# Patient Record
Sex: Female | Born: 1975 | Hispanic: No | Smoking: Never smoker
Health system: Southern US, Community
[De-identification: ages and names within clinical notes are randomized; demographics above are authoritative.]

## PROBLEM LIST (undated history)

## (undated) DIAGNOSIS — N809 Endometriosis, unspecified: Secondary | ICD-10-CM

## (undated) DIAGNOSIS — B999 Unspecified infectious disease: Secondary | ICD-10-CM

## (undated) DIAGNOSIS — R569 Unspecified convulsions: Secondary | ICD-10-CM

## (undated) DIAGNOSIS — G253 Myoclonus: Secondary | ICD-10-CM

## (undated) DIAGNOSIS — K219 Gastro-esophageal reflux disease without esophagitis: Secondary | ICD-10-CM

## (undated) DIAGNOSIS — A609 Anogenital herpesviral infection, unspecified: Secondary | ICD-10-CM

## (undated) HISTORY — PX: TONSILLECTOMY: SUR1361

## (undated) HISTORY — PX: BREAST SURGERY: SHX581

---

## 2010-09-22 ENCOUNTER — Inpatient Hospital Stay (HOSPITAL_COMMUNITY): Admission: AD | Admit: 2010-09-22 | Payer: Self-pay | Source: Home / Self Care | Admitting: Obstetrics and Gynecology

## 2012-10-21 ENCOUNTER — Emergency Department (HOSPITAL_COMMUNITY)
Admission: EM | Admit: 2012-10-21 | Discharge: 2012-10-21 | Disposition: A | Discharge status: Home / Self Care | Payer: No Typology Code available for payment source | Attending: Emergency Medicine | Admitting: Emergency Medicine

## 2012-10-21 ENCOUNTER — Emergency Department (HOSPITAL_COMMUNITY): Payer: No Typology Code available for payment source

## 2012-10-21 ENCOUNTER — Encounter (HOSPITAL_COMMUNITY): Payer: Self-pay | Admitting: Emergency Medicine

## 2012-10-21 DIAGNOSIS — Y9389 Activity, other specified: Secondary | ICD-10-CM | POA: Insufficient documentation

## 2012-10-21 DIAGNOSIS — S5291XA Unspecified fracture of right forearm, initial encounter for closed fracture: Secondary | ICD-10-CM

## 2012-10-21 DIAGNOSIS — X500XXA Overexertion from strenuous movement or load, initial encounter: Secondary | ICD-10-CM | POA: Insufficient documentation

## 2012-10-21 DIAGNOSIS — S52599A Other fractures of lower end of unspecified radius, initial encounter for closed fracture: Secondary | ICD-10-CM | POA: Insufficient documentation

## 2012-10-21 DIAGNOSIS — Z880 Allergy status to penicillin: Secondary | ICD-10-CM | POA: Insufficient documentation

## 2012-10-21 DIAGNOSIS — Z91040 Latex allergy status: Secondary | ICD-10-CM | POA: Insufficient documentation

## 2012-10-21 DIAGNOSIS — Y929 Unspecified place or not applicable: Secondary | ICD-10-CM | POA: Insufficient documentation

## 2012-10-21 DIAGNOSIS — Z79899 Other long term (current) drug therapy: Secondary | ICD-10-CM | POA: Insufficient documentation

## 2012-10-21 MED ORDER — ONDANSETRON HCL 4 MG PO TABS
4.0000 mg | ORAL_TABLET | Freq: Once | ORAL | Status: AC
  Administered 2012-10-21: 4 mg via ORAL
  Filled 2012-10-21: qty 1

## 2012-10-21 MED ORDER — DICLOFENAC SODIUM 75 MG PO TBEC: 75.0000 mg | DELAYED_RELEASE_TABLET | Freq: Two times a day (BID) | ORAL | Status: DC

## 2012-10-21 MED ORDER — HYDROCODONE-ACETAMINOPHEN 5-325 MG PO TABS
2.0000 | ORAL_TABLET | Freq: Once | ORAL | Status: AC
  Administered 2012-10-21: 2 via ORAL
  Filled 2012-10-21: qty 2

## 2012-10-21 MED ORDER — OXYCODONE-ACETAMINOPHEN 5-325 MG PO TABS: 1.0000 | ORAL_TABLET | ORAL | Status: DC | PRN

## 2012-10-21 NOTE — ED Provider Notes (Signed)
Medical screening examination/treatment/procedure(s) were performed by non-physician practitioner and as supervising physician I was immediately available for consultation/collaboration.    Vida Roller, MD 10/21/12 [phone]

## 2012-10-21 NOTE — ED Notes (Signed)
Pt c/o wrist injury that occurred this morning while lifting weights.

## 2012-10-21 NOTE — ED Provider Notes (Signed)
History     CSN: [id number]  Arrival date & time 10/21/12  1113   First MD Initiated Contact with Patient 10/21/12 1124      Chief Complaint  Patient presents with  . Wrist Injury    (Consider location/radiation/quality/duration/timing/severity/associated sxs/prior treatment) Patient is a 37 y.o. female presenting with wrist injury. The history is provided by the patient.  Wrist Injury Location:  Wrist Time since incident:  1 hour Injury: yes   Mechanism of injury comment:  Pt was lifting a weight when she heard a snap Wrist location:  R wrist Pain details:    Quality:  Throbbing   Radiates to:  Does not radiate   Severity:  Severe   Onset quality:  Sudden   Timing:  Constant   Progression:  Worsening Chronicity:  New Handedness:  Right-handed Dislocation: no   Foreign body present:  No foreign bodies Relieved by:  Nothing Worsened by:  Movement Ineffective treatments:  Ice Associated symptoms: decreased range of motion   Associated symptoms: no back pain, no neck pain and no numbness   Risk factors: no frequent fractures     History reviewed. No pertinent past medical history.  History reviewed. No pertinent past surgical history.  History reviewed. No pertinent family history.  History  Substance Use Topics  . Smoking status: Not on file  . Smokeless tobacco: Not on file  . Alcohol Use: Not on file    OB History   Grav Para Term Preterm Abortions TAB SAB Ect Mult Living                  Review of Systems  Constitutional: Negative for activity change.       All ROS Neg except as noted in HPI  HENT: Negative for nosebleeds and neck pain.   Eyes: Negative for photophobia and discharge.  Respiratory: Negative for cough, shortness of breath and wheezing.   Cardiovascular: Negative for chest pain and palpitations.  Gastrointestinal: Negative for abdominal pain and blood in stool.  Genitourinary: Negative for dysuria, frequency and hematuria.   Musculoskeletal: Negative for back pain and arthralgias.  Skin: Negative.   Neurological: Negative for dizziness, seizures and speech difficulty.  Psychiatric/Behavioral: Negative for hallucinations and confusion.    Allergies  Penicillins; Latex; and Sulfa antibiotics  Home Medications   Current Outpatient Rx  Name  Route  Sig  Dispense  Refill  . desogestrel-ethinyl estradiol (EMOQUETTE) 0.15-30 MG-MCG tablet   Oral   Take 1 tablet by mouth daily.         . Multiple Vitamin (MULTIVITAMIN WITH MINERALS) TABS   Oral   Take 1 tablet by mouth daily.         . diclofenac (VOLTAREN) 75 MG EC tablet   Oral   Take 1 tablet (75 mg total) by mouth 2 (two) times daily.   12 tablet   0   . oxyCODONE-acetaminophen (PERCOCET) 5-325 MG per tablet   Oral   Take 1 tablet by mouth every 4 (four) hours as needed for pain.   20 tablet   0     BP 134/73  Pulse 83  Temp(Src) 98.2 F (36.8 C)  Resp 20  SpO2 100%  LMP 10/09/2012  Physical Exam  Nursing note and vitals reviewed. Constitutional: She is oriented to person, place, and time. She appears well-developed and well-nourished.  Non-toxic appearance.  HENT:  Head: Normocephalic.  Right Ear: Tympanic membrane and external ear normal.  Left Ear: Tympanic membrane and  external ear normal.  Eyes: EOM and lids are normal. Pupils are equal, round, and reactive to light.  Neck: Normal range of motion. Neck supple. Carotid bruit is not present.  Cardiovascular: Normal rate, regular rhythm, normal heart sounds, intact distal pulses and normal pulses.   Pulmonary/Chest: Breath sounds normal. No respiratory distress.  Abdominal: Soft. Bowel sounds are normal. There is no tenderness. There is no guarding.  Musculoskeletal: Normal range of motion.  FROM of the right shoulder and elbow. Pain of  right wrist, mod swelling. Cap refill less than 3 sec. Distal sensory wnl.   Lymphadenopathy:       Head (right side): No submandibular  adenopathy present.       Head (left side): No submandibular adenopathy present.    She has no cervical adenopathy.  Neurological: She is alert and oriented to person, place, and time. She has normal strength. No cranial nerve deficit or sensory deficit.  Skin: Skin is warm and dry.  Psychiatric: She has a normal mood and affect. Her speech is normal.    ED Course  Procedures (including critical care time)  Labs Reviewed - No data to display Dg Wrist Complete Right  10/21/2012   *RADIOLOGY REPORT*  Clinical Data: Injury during weight lifting with pain and swelling of the right wrist.  RIGHT WRIST - COMPLETE 3+ VIEW  Comparison: None.  Findings: There is a transverse, non comminuted transverse fracture of the distal radius.  It crosses the metaphysis.  There is minimal, approximate 3 mm, posterior displacement and there is dorsal angulation of the distal radial articular surface of approximate 16 degrees.  There are no other fractures.  The wrist joints are normally spaced and aligned.  There is mild diffuse surrounding soft tissue swelling.  IMPRESSION: Transverse distal right radial metaphyseal fracture with no significant displacement but with 16 degrees of dorsal articular surface angulation.   Original Report Authenticated By: Amie Portland, M.D.     1. Radius fracture, right, closed, initial encounter       MDM  I have reviewed nursing notes, vital signs, and all appropriate lab and imaging results for this patient.  Pt was lifting a weight when she felt and heard a pop in the right wrist. Pt was taken to her knees due to pain.  Xray reveals a transverse fx of the radium with mild angulation. Pt fitted with sugar tong and sling. Pt to call Dr Sherlean Foot for evaluation next week.      Kathie Dike, PA-C 10/21/12 1249

## 2014-11-01 ENCOUNTER — Ambulatory Visit (INDEPENDENT_AMBULATORY_CARE_PROVIDER_SITE_OTHER): Payer: No Typology Code available for payment source

## 2014-11-01 ENCOUNTER — Encounter: Payer: Self-pay | Admitting: Podiatry

## 2014-11-01 ENCOUNTER — Ambulatory Visit (INDEPENDENT_AMBULATORY_CARE_PROVIDER_SITE_OTHER): Payer: PRIVATE HEALTH INSURANCE | Admitting: Podiatry

## 2014-11-01 VITALS — BP 135/80 | HR 66 | Resp 12 | Ht 63.0 in | Wt 190.0 lb

## 2014-11-01 DIAGNOSIS — M722 Plantar fascial fibromatosis: Secondary | ICD-10-CM | POA: Diagnosis not present

## 2014-11-01 DIAGNOSIS — M79673 Pain in unspecified foot: Secondary | ICD-10-CM | POA: Diagnosis not present

## 2014-11-01 DIAGNOSIS — M79672 Pain in left foot: Secondary | ICD-10-CM | POA: Diagnosis not present

## 2014-11-01 MED ORDER — TRIAMCINOLONE ACETONIDE 10 MG/ML IJ SUSP
10.0000 mg | Freq: Once | INTRAMUSCULAR | Status: AC
  Administered 2014-11-01: 10 mg

## 2014-11-01 MED ORDER — DICLOFENAC SODIUM 75 MG PO TBEC: 75.0000 mg | DELAYED_RELEASE_TABLET | Freq: Two times a day (BID) | ORAL | Status: DC

## 2014-11-01 NOTE — Progress Notes (Signed)
   Subjective:    Patient ID: Laura Valentine, female    DOB: May 13, 1976, 39 y.o.   MRN: [id number]  HPI "I can't put weight on my foot in the heel.  That sucks because I work in the evening.  I've done the ice, hot and Ibuprofen.  It doesn't help because when I get up in the morning it hurts to touch the floor.  It feels bruised."   Review of Systems     Objective:   Physical Exam        Assessment & Plan:

## 2014-11-01 NOTE — Patient Instructions (Signed)

## 2014-11-04 ENCOUNTER — Ambulatory Visit: Payer: No Typology Code available for payment source | Admitting: Podiatry

## 2014-11-04 NOTE — Progress Notes (Signed)
Subjective:     Patient ID: Laura Valentine, female   DOB: 1976-05-03, 39 y.o.   MRN: [id number]  HPI patient states that her left heel has been very sore recently and she does not remember specific injury to it. States that it's hard for her to put it on the ground and she needs to be able to work   Review of Systems  All other systems reviewed and are negative.      Objective:   Physical Exam  Constitutional: She is oriented to person, place, and time.  Cardiovascular: Intact distal pulses.   Musculoskeletal: Normal range of motion.  Neurological: She is oriented to person, place, and time.  Skin: Skin is warm.  Nursing note and vitals reviewed.  neurovascular status intact muscle strength adequate with range of motion within normal limits. Patient's noted to have discomfort plantar aspect left heel at the insertional point tendon into the calcaneus with fluid buildup around the medial band. She has moderate depression of the arch also noted and is found to have good digital perfusion and is well oriented 3     Assessment:     Acute plantar fasciitis left with possibility for stress fracture the calcaneus    Plan:     H&P and x-rays reviewed. At this time I did go ahead and I did a careful plantar injections 3 Milligan Kenalog 5 mill grams Xylocaine and applied brace advised on ice placed on oral anti-inflammatory's and elevation. We will see her back in the next several weeks or earlier if any issues should occur

## 2014-11-15 ENCOUNTER — Ambulatory Visit (INDEPENDENT_AMBULATORY_CARE_PROVIDER_SITE_OTHER): Payer: PRIVATE HEALTH INSURANCE | Admitting: Podiatry

## 2014-11-15 ENCOUNTER — Encounter: Payer: Self-pay | Admitting: Podiatry

## 2014-11-15 VITALS — BP 122/70 | HR 73 | Resp 14

## 2014-11-15 DIAGNOSIS — M79672 Pain in left foot: Secondary | ICD-10-CM

## 2014-11-15 DIAGNOSIS — M722 Plantar fascial fibromatosis: Secondary | ICD-10-CM | POA: Diagnosis not present

## 2014-11-15 MED ORDER — TRIAMCINOLONE ACETONIDE 10 MG/ML IJ SUSP
10.0000 mg | Freq: Once | INTRAMUSCULAR | Status: AC
  Administered 2014-11-15: 10 mg

## 2014-11-16 NOTE — Progress Notes (Signed)
Subjective:     Patient ID: Laura Valentine, female   DOB: 1976-01-11, 40 y.o.   MRN: [id number]  HPI patient presents stating my heel is doing better but it still hurting and this one area   Review of Systems     Objective:   Physical Exam Neurovascular status intact muscle strength adequate with one area medial side but still quite inflamed and sore when pressed    Assessment:     Plantar fasciitis left with inflammation and fluid around the medial band    Plan:     Final injection 3 mg Kenalog 5 mg Xylocaine administered and instructed on physical therapy supportive shoes and reappoint as needed

## 2017-06-07 LAB — OB RESULTS CONSOLE HIV ANTIBODY (ROUTINE TESTING): HIV: NONREACTIVE

## 2017-06-07 LAB — OB RESULTS CONSOLE GC/CHLAMYDIA
Chlamydia: NEGATIVE
GC PROBE AMP, GENITAL: NEGATIVE

## 2017-06-07 LAB — OB RESULTS CONSOLE ABO/RH: RH Type: POSITIVE

## 2017-06-07 LAB — OB RESULTS CONSOLE ANTIBODY SCREEN: ANTIBODY SCREEN: NEGATIVE

## 2017-06-07 LAB — OB RESULTS CONSOLE HEPATITIS B SURFACE ANTIGEN: HEP B S AG: NEGATIVE

## 2017-06-07 LAB — OB RESULTS CONSOLE RUBELLA ANTIBODY, IGM: Rubella: IMMUNE

## 2017-06-07 LAB — OB RESULTS CONSOLE RPR: RPR: NONREACTIVE

## 2017-11-25 ENCOUNTER — Other Ambulatory Visit: Payer: Self-pay | Admitting: Obstetrics and Gynecology

## 2017-11-29 ENCOUNTER — Other Ambulatory Visit: Payer: Self-pay | Admitting: Obstetrics and Gynecology

## 2017-12-08 LAB — OB RESULTS CONSOLE GBS: GBS: NEGATIVE

## 2017-12-12 ENCOUNTER — Encounter (HOSPITAL_COMMUNITY): Payer: Self-pay

## 2017-12-13 ENCOUNTER — Telehealth (HOSPITAL_COMMUNITY): Payer: Self-pay | Admitting: *Deleted

## 2017-12-13 NOTE — Telephone Encounter (Signed)
Preadmission screen  

## 2017-12-14 ENCOUNTER — Encounter (HOSPITAL_COMMUNITY): Payer: Self-pay

## 2017-12-22 ENCOUNTER — Inpatient Hospital Stay (HOSPITAL_COMMUNITY): Payer: Managed Care, Other (non HMO) | Admitting: Anesthesiology

## 2017-12-22 ENCOUNTER — Encounter (HOSPITAL_COMMUNITY): Payer: Self-pay | Admitting: *Deleted

## 2017-12-22 ENCOUNTER — Encounter (HOSPITAL_COMMUNITY)
Admission: AD | Disposition: A | Discharge status: Other Acute Inpatient Hospital | Payer: Self-pay | Source: Ambulatory Visit | Attending: Obstetrics and Gynecology

## 2017-12-22 ENCOUNTER — Other Ambulatory Visit: Payer: Self-pay

## 2017-12-22 ENCOUNTER — Inpatient Hospital Stay (HOSPITAL_COMMUNITY)
Admission: AD | Admit: 2017-12-22 | Discharge: 2018-01-02 | DRG: 783 | Disposition: A | Discharge status: Other Acute Inpatient Hospital | Payer: Managed Care, Other (non HMO) | Source: Ambulatory Visit | Attending: Obstetrics and Gynecology | Admitting: Obstetrics and Gynecology

## 2017-12-22 DIAGNOSIS — O9832 Other infections with a predominantly sexual mode of transmission complicating childbirth: Secondary | ICD-10-CM | POA: Diagnosis present

## 2017-12-22 DIAGNOSIS — J9600 Acute respiratory failure, unspecified whether with hypoxia or hypercapnia: Secondary | ICD-10-CM

## 2017-12-22 DIAGNOSIS — K567 Ileus, unspecified: Secondary | ICD-10-CM | POA: Diagnosis present

## 2017-12-22 DIAGNOSIS — O159 Eclampsia, unspecified as to time period: Secondary | ICD-10-CM

## 2017-12-22 DIAGNOSIS — Z4659 Encounter for fitting and adjustment of other gastrointestinal appliance and device: Secondary | ICD-10-CM

## 2017-12-22 DIAGNOSIS — I6783 Posterior reversible encephalopathy syndrome: Secondary | ICD-10-CM | POA: Diagnosis not present

## 2017-12-22 DIAGNOSIS — Z302 Encounter for sterilization: Secondary | ICD-10-CM | POA: Diagnosis not present

## 2017-12-22 DIAGNOSIS — O1493 Unspecified pre-eclampsia, third trimester: Secondary | ICD-10-CM | POA: Diagnosis not present

## 2017-12-22 DIAGNOSIS — R509 Fever, unspecified: Secondary | ICD-10-CM | POA: Diagnosis not present

## 2017-12-22 DIAGNOSIS — J9601 Acute respiratory failure with hypoxia: Secondary | ICD-10-CM | POA: Diagnosis not present

## 2017-12-22 DIAGNOSIS — K219 Gastro-esophageal reflux disease without esophagitis: Secondary | ICD-10-CM | POA: Diagnosis present

## 2017-12-22 DIAGNOSIS — O99284 Endocrine, nutritional and metabolic diseases complicating childbirth: Secondary | ICD-10-CM | POA: Diagnosis present

## 2017-12-22 DIAGNOSIS — O1414 Severe pre-eclampsia complicating childbirth: Secondary | ICD-10-CM | POA: Diagnosis present

## 2017-12-22 DIAGNOSIS — G931 Anoxic brain damage, not elsewhere classified: Secondary | ICD-10-CM | POA: Diagnosis not present

## 2017-12-22 DIAGNOSIS — Z98891 History of uterine scar from previous surgery: Secondary | ICD-10-CM | POA: Diagnosis not present

## 2017-12-22 DIAGNOSIS — O9942 Diseases of the circulatory system complicating childbirth: Secondary | ICD-10-CM | POA: Diagnosis present

## 2017-12-22 DIAGNOSIS — O2662 Liver and biliary tract disorders in childbirth: Secondary | ICD-10-CM | POA: Diagnosis present

## 2017-12-22 DIAGNOSIS — G9341 Metabolic encephalopathy: Secondary | ICD-10-CM | POA: Diagnosis not present

## 2017-12-22 DIAGNOSIS — O34211 Maternal care for low transverse scar from previous cesarean delivery: Secondary | ICD-10-CM | POA: Diagnosis present

## 2017-12-22 DIAGNOSIS — K7200 Acute and subacute hepatic failure without coma: Secondary | ICD-10-CM | POA: Diagnosis not present

## 2017-12-22 DIAGNOSIS — G40901 Epilepsy, unspecified, not intractable, with status epilepticus: Secondary | ICD-10-CM | POA: Diagnosis not present

## 2017-12-22 DIAGNOSIS — E872 Acidosis: Secondary | ICD-10-CM | POA: Diagnosis present

## 2017-12-22 DIAGNOSIS — Z880 Allergy status to penicillin: Secondary | ICD-10-CM

## 2017-12-22 DIAGNOSIS — Z9889 Other specified postprocedural states: Secondary | ICD-10-CM

## 2017-12-22 DIAGNOSIS — T884XXA Failed or difficult intubation, initial encounter: Secondary | ICD-10-CM

## 2017-12-22 DIAGNOSIS — B9561 Methicillin susceptible Staphylococcus aureus infection as the cause of diseases classified elsewhere: Secondary | ICD-10-CM | POA: Diagnosis present

## 2017-12-22 DIAGNOSIS — O99354 Diseases of the nervous system complicating childbirth: Secondary | ICD-10-CM | POA: Diagnosis present

## 2017-12-22 DIAGNOSIS — O9912 Other diseases of the blood and blood-forming organs and certain disorders involving the immune mechanism complicating childbirth: Secondary | ICD-10-CM | POA: Diagnosis present

## 2017-12-22 DIAGNOSIS — Z0189 Encounter for other specified special examinations: Secondary | ICD-10-CM

## 2017-12-22 DIAGNOSIS — O99355 Diseases of the nervous system complicating the puerperium: Secondary | ICD-10-CM | POA: Diagnosis present

## 2017-12-22 DIAGNOSIS — R4182 Altered mental status, unspecified: Secondary | ICD-10-CM | POA: Diagnosis not present

## 2017-12-22 DIAGNOSIS — J690 Pneumonitis due to inhalation of food and vomit: Secondary | ICD-10-CM | POA: Diagnosis not present

## 2017-12-22 DIAGNOSIS — G253 Myoclonus: Secondary | ICD-10-CM | POA: Diagnosis not present

## 2017-12-22 DIAGNOSIS — A6000 Herpesviral infection of urogenital system, unspecified: Secondary | ICD-10-CM | POA: Diagnosis present

## 2017-12-22 DIAGNOSIS — O9081 Anemia of the puerperium: Secondary | ICD-10-CM | POA: Diagnosis not present

## 2017-12-22 DIAGNOSIS — J9690 Respiratory failure, unspecified, unspecified whether with hypoxia or hypercapnia: Secondary | ICD-10-CM

## 2017-12-22 DIAGNOSIS — Z9289 Personal history of other medical treatment: Secondary | ICD-10-CM

## 2017-12-22 DIAGNOSIS — J80 Acute respiratory distress syndrome: Secondary | ICD-10-CM | POA: Diagnosis not present

## 2017-12-22 DIAGNOSIS — O9953 Diseases of the respiratory system complicating the puerperium: Secondary | ICD-10-CM | POA: Diagnosis not present

## 2017-12-22 DIAGNOSIS — Z789 Other specified health status: Secondary | ICD-10-CM

## 2017-12-22 DIAGNOSIS — O9962 Diseases of the digestive system complicating childbirth: Secondary | ICD-10-CM | POA: Diagnosis present

## 2017-12-22 DIAGNOSIS — Z91040 Latex allergy status: Secondary | ICD-10-CM

## 2017-12-22 DIAGNOSIS — I469 Cardiac arrest, cause unspecified: Secondary | ICD-10-CM | POA: Diagnosis not present

## 2017-12-22 DIAGNOSIS — O99214 Obesity complicating childbirth: Secondary | ICD-10-CM | POA: Diagnosis present

## 2017-12-22 DIAGNOSIS — Z3A38 38 weeks gestation of pregnancy: Secondary | ICD-10-CM

## 2017-12-22 DIAGNOSIS — Z452 Encounter for adjustment and management of vascular access device: Secondary | ICD-10-CM

## 2017-12-22 DIAGNOSIS — G40909 Epilepsy, unspecified, not intractable, without status epilepticus: Secondary | ICD-10-CM | POA: Diagnosis not present

## 2017-12-22 DIAGNOSIS — R579 Shock, unspecified: Secondary | ICD-10-CM | POA: Diagnosis not present

## 2017-12-22 DIAGNOSIS — O9963 Diseases of the digestive system complicating the puerperium: Secondary | ICD-10-CM | POA: Diagnosis present

## 2017-12-22 DIAGNOSIS — O152 Eclampsia in the puerperium: Secondary | ICD-10-CM | POA: Diagnosis not present

## 2017-12-22 DIAGNOSIS — R609 Edema, unspecified: Secondary | ICD-10-CM | POA: Diagnosis not present

## 2017-12-22 HISTORY — DX: Gastro-esophageal reflux disease without esophagitis: K21.9

## 2017-12-22 HISTORY — DX: Unspecified infectious disease: B99.9

## 2017-12-22 HISTORY — DX: Endometriosis, unspecified: N80.9

## 2017-12-22 LAB — ABO/RH: ABO/RH(D): O POS

## 2017-12-22 LAB — PROTEIN / CREATININE RATIO, URINE
CREATININE, URINE: 50 mg/dL
Protein Creatinine Ratio: 0.34 mg/mg{Cre} — ABNORMAL HIGH (ref 0.00–0.15)
Total Protein, Urine: 17 mg/dL

## 2017-12-22 LAB — COMPREHENSIVE METABOLIC PANEL
ALBUMIN: 2.6 g/dL — AB (ref 3.5–5.0)
ALT: 14 U/L (ref 0–44)
AST: 24 U/L (ref 15–41)
Alkaline Phosphatase: 155 U/L — ABNORMAL HIGH (ref 38–126)
Anion gap: 9 (ref 5–15)
BUN: 8 mg/dL (ref 6–20)
CHLORIDE: 107 mmol/L (ref 98–111)
CO2: 21 mmol/L — ABNORMAL LOW (ref 22–32)
Calcium: 9 mg/dL (ref 8.9–10.3)
Creatinine, Ser: 0.6 mg/dL (ref 0.44–1.00)
GFR calc Af Amer: >60 mL/min (ref >60)
GLUCOSE: 93 mg/dL (ref 70–99)
POTASSIUM: 4.3 mmol/L (ref 3.5–5.1)
Sodium: 137 mmol/L (ref 135–145)
Total Bilirubin: 0.1 mg/dL — ABNORMAL LOW (ref 0.3–1.2)
Total Protein: 5.7 g/dL — ABNORMAL LOW (ref 6.5–8.1)

## 2017-12-22 LAB — CBC
HCT: 29.6 % — ABNORMAL LOW (ref 36.0–46.0)
Hemoglobin: 10 g/dL — ABNORMAL LOW (ref 12.0–15.0)
MCH: 26.5 pg (ref 26.0–34.0)
MCHC: 33.8 g/dL (ref 30.0–36.0)
MCV: 78.3 fL (ref 78.0–100.0)
PLATELETS: 226 10*3/uL (ref 150–400)
RBC: 3.78 MIL/uL — ABNORMAL LOW (ref 3.87–5.11)
RDW: 14.9 % (ref 11.5–15.5)
WBC: 9.2 10*3/uL (ref 4.0–10.5)

## 2017-12-22 LAB — TYPE AND SCREEN
ABO/RH(D): O POS
Antibody Screen: NEGATIVE

## 2017-12-22 SURGERY — Surgical Case
Anesthesia: Spinal

## 2017-12-22 MED ORDER — LABETALOL HCL 5 MG/ML IV SOLN
20.0000 mg | INTRAVENOUS | Status: DC | PRN
  Administered 2017-12-22: 20 mg via INTRAVENOUS
  Filled 2017-12-22: qty 4

## 2017-12-22 MED ORDER — OXYCODONE-ACETAMINOPHEN 5-325 MG PO TABS: 2.0000 | ORAL_TABLET | ORAL | Status: DC | PRN

## 2017-12-22 MED ORDER — MAGNESIUM SULFATE 40 G IN LACTATED RINGERS - SIMPLE: 2.0000 g/h | Freq: Once | INTRAVENOUS | Status: DC

## 2017-12-22 MED ORDER — LACTATED RINGERS IV SOLN
INTRAVENOUS | Status: DC
  Administered 2017-12-22 (×2): via INTRAVENOUS

## 2017-12-22 MED ORDER — MEPERIDINE HCL 25 MG/ML IJ SOLN: 6.2500 mg | INTRAMUSCULAR | Status: DC | PRN

## 2017-12-22 MED ORDER — TETANUS-DIPHTH-ACELL PERTUSSIS 5-2.5-18.5 LF-MCG/0.5 IM SUSP
0.5000 mL | Freq: Once | INTRAMUSCULAR | Status: DC
  Filled 2017-12-22: qty 0.5

## 2017-12-22 MED ORDER — MEASLES, MUMPS & RUBELLA VAC ~~LOC~~ INJ
0.5000 mL | INJECTION | Freq: Once | SUBCUTANEOUS | Status: DC
  Filled 2017-12-22: qty 0.5

## 2017-12-22 MED ORDER — PRENATAL MULTIVITAMIN CH
1.0000 | ORAL_TABLET | Freq: Every day | ORAL | Status: DC
  Administered 2017-12-25 – 2018-01-02 (×8): 1 via ORAL
  Filled 2017-12-22 (×12): qty 1

## 2017-12-22 MED ORDER — BUPIVACAINE IN DEXTROSE 0.75-8.25 % IT SOLN
INTRATHECAL | Status: DC | PRN
  Administered 2017-12-22: 1.7 mL via INTRATHECAL

## 2017-12-22 MED ORDER — LACTATED RINGERS IV SOLN: INTRAVENOUS | Status: DC

## 2017-12-22 MED ORDER — SOD CITRATE-CITRIC ACID 500-334 MG/5ML PO SOLN: 30.0000 mL | ORAL | Status: DC

## 2017-12-22 MED ORDER — DIPHENHYDRAMINE HCL 25 MG PO CAPS: 25.0000 mg | ORAL_CAPSULE | Freq: Four times a day (QID) | ORAL | Status: DC | PRN

## 2017-12-22 MED ORDER — WITCH HAZEL-GLYCERIN EX PADS
1.0000 "application " | MEDICATED_PAD | CUTANEOUS | Status: DC | PRN
  Filled 2017-12-22: qty 100

## 2017-12-22 MED ORDER — LABETALOL HCL 5 MG/ML IV SOLN: 40.0000 mg | INTRAVENOUS | Status: DC | PRN

## 2017-12-22 MED ORDER — ACETAMINOPHEN 325 MG PO TABS: 650.0000 mg | ORAL_TABLET | ORAL | Status: DC | PRN

## 2017-12-22 MED ORDER — SIMETHICONE 80 MG PO CHEW: 80.0000 mg | CHEWABLE_TABLET | Freq: Three times a day (TID) | ORAL | Status: DC

## 2017-12-22 MED ORDER — MAGNESIUM SULFATE BOLUS VIA INFUSION
4.0000 g | Freq: Once | INTRAVENOUS | Status: AC
  Administered 2017-12-22: 4 g via INTRAVENOUS
  Filled 2017-12-22: qty 500

## 2017-12-22 MED ORDER — METHYLERGONOVINE MALEATE 0.2 MG PO TABS
0.2000 mg | ORAL_TABLET | ORAL | Status: DC | PRN
  Filled 2017-12-22: qty 1

## 2017-12-22 MED ORDER — SODIUM CHLORIDE 0.9 % IR SOLN
Status: DC | PRN
  Administered 2017-12-22: 1

## 2017-12-22 MED ORDER — SIMETHICONE 80 MG PO CHEW
80.0000 mg | CHEWABLE_TABLET | ORAL | Status: DC
  Administered 2017-12-23: 80 mg via ORAL
  Filled 2017-12-22: qty 1

## 2017-12-22 MED ORDER — BISACODYL 10 MG RE SUPP
10.0000 mg | Freq: Every day | RECTAL | Status: DC | PRN
  Administered 2017-12-27: 10 mg via RECTAL
  Filled 2017-12-22: qty 1

## 2017-12-22 MED ORDER — EPHEDRINE SULFATE 50 MG/ML IJ SOLN
INTRAMUSCULAR | Status: DC | PRN
  Administered 2017-12-22 (×3): 5 mg via INTRAVENOUS

## 2017-12-22 MED ORDER — ALBUTEROL SULFATE (2.5 MG/3ML) 0.083% IN NEBU: 2.5000 mg | INHALATION_SOLUTION | Freq: Once | RESPIRATORY_TRACT | Status: DC

## 2017-12-22 MED ORDER — OXYTOCIN 10 UNIT/ML IJ SOLN
INTRAMUSCULAR | Status: AC
  Filled 2017-12-22: qty 4

## 2017-12-22 MED ORDER — LACTATED RINGERS IV SOLN
INTRAVENOUS | Status: DC | PRN
  Administered 2017-12-22: 40 [IU] via INTRAVENOUS

## 2017-12-22 MED ORDER — OXYCODONE-ACETAMINOPHEN 5-325 MG PO TABS: 1.0000 | ORAL_TABLET | ORAL | Status: DC | PRN

## 2017-12-22 MED ORDER — MORPHINE SULFATE (PF) 0.5 MG/ML IJ SOLN
INTRAMUSCULAR | Status: DC | PRN
  Administered 2017-12-22: .2 mg via INTRATHECAL

## 2017-12-22 MED ORDER — ZOLPIDEM TARTRATE 5 MG PO TABS: 5.0000 mg | ORAL_TABLET | Freq: Every evening | ORAL | Status: DC | PRN

## 2017-12-22 MED ORDER — HYDRALAZINE HCL 20 MG/ML IJ SOLN: 10.0000 mg | INTRAMUSCULAR | Status: DC | PRN

## 2017-12-22 MED ORDER — ALBUTEROL SULFATE (2.5 MG/3ML) 0.083% IN NEBU
2.5000 mg | INHALATION_SOLUTION | RESPIRATORY_TRACT | Status: AC
  Administered 2017-12-22: 2.5 mg via RESPIRATORY_TRACT

## 2017-12-22 MED ORDER — LORATADINE 10 MG PO TABS
10.0000 mg | ORAL_TABLET | Freq: Every day | ORAL | Status: DC
  Filled 2017-12-22: qty 1

## 2017-12-22 MED ORDER — MAGNESIUM SULFATE 40 G IN LACTATED RINGERS - SIMPLE
2.0000 g/h | INTRAVENOUS | Status: DC
  Administered 2017-12-22: 2 g/h via INTRAVENOUS
  Filled 2017-12-22: qty 500
  Filled 2017-12-22: qty 40

## 2017-12-22 MED ORDER — FLEET ENEMA 7-19 GM/118ML RE ENEM: 1.0000 | ENEMA | Freq: Every day | RECTAL | Status: DC | PRN

## 2017-12-22 MED ORDER — SENNOSIDES-DOCUSATE SODIUM 8.6-50 MG PO TABS
2.0000 | ORAL_TABLET | ORAL | Status: DC
  Administered 2017-12-23 – 2017-12-26 (×5): 2 via ORAL
  Filled 2017-12-22 (×6): qty 2

## 2017-12-22 MED ORDER — SCOPOLAMINE 1 MG/3DAYS TD PT72
MEDICATED_PATCH | TRANSDERMAL | Status: AC
  Filled 2017-12-22: qty 1

## 2017-12-22 MED ORDER — ACETAMINOPHEN 10 MG/ML IV SOLN
1000.0000 mg | Freq: Once | INTRAVENOUS | Status: DC | PRN
  Administered 2017-12-22: 1000 mg via INTRAVENOUS

## 2017-12-22 MED ORDER — SCOPOLAMINE 1 MG/3DAYS TD PT72
MEDICATED_PATCH | TRANSDERMAL | Status: DC | PRN
  Administered 2017-12-22: 1 via TRANSDERMAL

## 2017-12-22 MED ORDER — HYDROCODONE-ACETAMINOPHEN 7.5-325 MG PO TABS: 1.0000 | ORAL_TABLET | Freq: Once | ORAL | Status: DC | PRN

## 2017-12-22 MED ORDER — GENTAMICIN SULFATE 40 MG/ML IJ SOLN
INTRAVENOUS | Status: AC
  Administered 2017-12-22: 121 mg via INTRAVENOUS
  Filled 2017-12-22: qty 15

## 2017-12-22 MED ORDER — ALBUTEROL SULFATE (2.5 MG/3ML) 0.083% IN NEBU
INHALATION_SOLUTION | RESPIRATORY_TRACT | Status: AC
  Filled 2017-12-22: qty 3

## 2017-12-22 MED ORDER — FAMOTIDINE 20 MG PO TABS
20.0000 mg | ORAL_TABLET | Freq: Once | ORAL | Status: AC
  Administered 2017-12-22: 20 mg via ORAL
  Filled 2017-12-22: qty 1

## 2017-12-22 MED ORDER — ONDANSETRON HCL 4 MG/2ML IJ SOLN
INTRAMUSCULAR | Status: AC
  Filled 2017-12-22: qty 2

## 2017-12-22 MED ORDER — IBUPROFEN 200 MG PO TABS: 800.0000 mg | ORAL_TABLET | Freq: Three times a day (TID) | ORAL | Status: DC

## 2017-12-22 MED ORDER — PANTOPRAZOLE SODIUM 40 MG PO TBEC: 80.0000 mg | DELAYED_RELEASE_TABLET | Freq: Every day | ORAL | Status: DC

## 2017-12-22 MED ORDER — LACTATED RINGERS IV SOLN
INTRAVENOUS | Status: DC
  Administered 2017-12-23: 100 mL/h via INTRAVENOUS

## 2017-12-22 MED ORDER — SOD CITRATE-CITRIC ACID 500-334 MG/5ML PO SOLN
30.0000 mL | Freq: Once | ORAL | Status: AC
  Administered 2017-12-22: 30 mL via ORAL
  Filled 2017-12-22: qty 15

## 2017-12-22 MED ORDER — SIMETHICONE 80 MG PO CHEW: 80.0000 mg | CHEWABLE_TABLET | ORAL | Status: DC | PRN

## 2017-12-22 MED ORDER — LABETALOL HCL 5 MG/ML IV SOLN: 80.0000 mg | INTRAVENOUS | Status: DC | PRN

## 2017-12-22 MED ORDER — OXYTOCIN 40 UNITS IN LACTATED RINGERS INFUSION - SIMPLE MED: 2.5000 [IU]/h | INTRAVENOUS | Status: DC

## 2017-12-22 MED ORDER — MORPHINE SULFATE (PF) 0.5 MG/ML IJ SOLN
INTRAMUSCULAR | Status: AC
  Filled 2017-12-22: qty 10

## 2017-12-22 MED ORDER — COCONUT OIL OIL
1.0000 "application " | TOPICAL_OIL | Status: DC | PRN
  Filled 2017-12-22: qty 120

## 2017-12-22 MED ORDER — HYDROMORPHONE HCL 1 MG/ML IJ SOLN: 0.2500 mg | INTRAMUSCULAR | Status: DC | PRN

## 2017-12-22 MED ORDER — ONDANSETRON HCL 4 MG/2ML IJ SOLN
INTRAMUSCULAR | Status: DC | PRN
  Administered 2017-12-22: 4 mg via INTRAVENOUS

## 2017-12-22 MED ORDER — ACETAMINOPHEN 10 MG/ML IV SOLN
INTRAVENOUS | Status: AC
  Administered 2017-12-22: 1000 mg via INTRAVENOUS
  Filled 2017-12-22: qty 100

## 2017-12-22 MED ORDER — DEXAMETHASONE SODIUM PHOSPHATE 4 MG/ML IJ SOLN
INTRAMUSCULAR | Status: AC
  Filled 2017-12-22: qty 1

## 2017-12-22 MED ORDER — DEXAMETHASONE SODIUM PHOSPHATE 4 MG/ML IJ SOLN
INTRAMUSCULAR | Status: DC | PRN
  Administered 2017-12-22: 4 mg via INTRAVENOUS

## 2017-12-22 MED ORDER — DIBUCAINE 1 % RE OINT: 1.0000 "application " | TOPICAL_OINTMENT | RECTAL | Status: DC | PRN

## 2017-12-22 MED ORDER — MENTHOL 3 MG MT LOZG: 1.0000 | LOZENGE | OROMUCOSAL | Status: DC | PRN

## 2017-12-22 MED ORDER — FERROUS SULFATE 325 (65 FE) MG PO TABS: 325.0000 mg | ORAL_TABLET | Freq: Two times a day (BID) | ORAL | Status: DC

## 2017-12-22 MED ORDER — METHYLERGONOVINE MALEATE 0.2 MG/ML IJ SOLN
0.2000 mg | INTRAMUSCULAR | Status: DC | PRN
  Filled 2017-12-22: qty 1

## 2017-12-22 MED ORDER — PROMETHAZINE HCL 25 MG/ML IJ SOLN: 6.2500 mg | INTRAMUSCULAR | Status: DC | PRN

## 2017-12-22 SURGICAL SUPPLY — 32 items
BENZOIN TINCTURE PRP APPL 2/3 (GAUZE/BANDAGES/DRESSINGS) ×2 IMPLANT
CLAMP CORD UMBIL (MISCELLANEOUS) IMPLANT
CLIP FILSHIE TUBAL LIGA STRL (Clip) ×4 IMPLANT
CLOTH BEACON ORANGE TIMEOUT ST (SAFETY) ×2 IMPLANT
DRSG OPSITE POSTOP 4X10 (GAUZE/BANDAGES/DRESSINGS) ×2 IMPLANT
ELECT REM PT RETURN 9FT ADLT (ELECTROSURGICAL) ×2
ELECTRODE REM PT RTRN 9FT ADLT (ELECTROSURGICAL) ×1 IMPLANT
EXTRACTOR VACUUM BELL STYLE (SUCTIONS) IMPLANT
GLOVE BIO SURGEON STRL SZ7 (GLOVE) ×2 IMPLANT
GLOVE BIOGEL PI IND STRL 7.0 (GLOVE) ×1 IMPLANT
GLOVE BIOGEL PI INDICATOR 7.0 (GLOVE) ×1
GOWN STRL REUS W/TWL LRG LVL3 (GOWN DISPOSABLE) ×4 IMPLANT
KIT ABG SYR 3ML LUER SLIP (SYRINGE) IMPLANT
NEEDLE HYPO 25X5/8 SAFETYGLIDE (NEEDLE) IMPLANT
NS IRRIG 1000ML POUR BTL (IV SOLUTION) ×2 IMPLANT
PACK C SECTION WH (CUSTOM PROCEDURE TRAY) ×2 IMPLANT
PAD OB MATERNITY 4.3X12.25 (PERSONAL CARE ITEMS) ×2 IMPLANT
PENCIL SMOKE EVAC W/HOLSTER (ELECTROSURGICAL) ×2 IMPLANT
RTRCTR C-SECT PINK 25CM LRG (MISCELLANEOUS) ×2 IMPLANT
STRIP CLOSURE SKIN 1/2X4 (GAUZE/BANDAGES/DRESSINGS) ×2 IMPLANT
SUT MNCRL 0 VIOLET CTX 36 (SUTURE) ×2 IMPLANT
SUT MONOCRYL 0 CTX 36 (SUTURE) ×2
SUT PDS AB 0 CTX 60 (SUTURE) IMPLANT
SUT PLAIN 2 0 XLH (SUTURE) IMPLANT
SUT VIC AB 0 CT1 27 (SUTURE) ×2
SUT VIC AB 0 CT1 27XBRD ANBCTR (SUTURE) ×2 IMPLANT
SUT VIC AB 2-0 CT1 27 (SUTURE) ×1
SUT VIC AB 2-0 CT1 TAPERPNT 27 (SUTURE) ×1 IMPLANT
SUT VIC AB 4-0 KS 27 (SUTURE) ×2 IMPLANT
TOWEL OR 17X24 6PK STRL BLUE (TOWEL DISPOSABLE) ×2 IMPLANT
TRAY FOLEY W/BAG SLVR 14FR LF (SET/KITS/TRAYS/PACK) ×2 IMPLANT
WATER STERILE IRR 1000ML POUR (IV SOLUTION) ×2 IMPLANT

## 2017-12-22 NOTE — Progress Notes (Signed)
Vitals:   12/22/17 1826 12/22/17 1835 12/22/17 1912 12/22/17 2007  BP: (!) 151/96 (!) 144/80 (!) 151/88 (!) 159/103  Pulse: 94 90 95 97  Resp: 18 18 16    Temp: 99 F (37.2 C)  98.4 F (36.9 C)   TempSrc: Oral  Oral   SpO2:      Weight: 262 lb 12 oz (119.2 kg)     Height: 5\' 3"  (1.6 m)       C/S planned shortly.

## 2017-12-22 NOTE — Op Note (Signed)
12/22/2017  9:29 PM  PATIENT:  Laura Valentine  42 y.o. female  PRE-OPERATIVE DIAGNOSIS:  repeat cesarean/ desires sterilization; pre-eclampsia  POST-OPERATIVE DIAGNOSIS:  repeat cesarean/ desires sterilization;  pre-eclampsia  PROCEDURE:  Procedure(s): REPEAT CESAREAN SECTION WITH BILATERAL TUBAL LIGATION (N/A)  SURGEON:  Surgeon(s) and Role:    Carrington Clamp* Mahima Hottle, MD - Primary  ANESTHESIA:   spinal  EBL:  549 mL   SPECIMEN:  No Specimen  DISPOSITION OF SPECIMEN:  N/A  COUNTS:  YES  TOURNIQUET:  * No tourniquets in log *  DICTATION: .Note written in EPIC  PLAN OF CARE: Admit to inpatient   PATIENT DISPOSITION:  PACU - hemodynamically stable.   Delay start of Pharmacological VTE agent (>24hrs) due to surgical blood loss or risk of bleeding: not applicable   Complications:  none Medications:  Benn MoulderGent, Clinda, Pitocin Findings:  Baby female, Apgars 8.8,9, weight P.   Normal tubes, ovaries and uterus seen.  Baby was skin to skin with mother after birth in the OR.  Technique:  After adequate spinal anesthesia was achieved, the patient was prepped and draped in usual sterile fashion.  A foley catheter was used to drain the bladder.  A pfannanstiel incision was made with the scalpel and carried down to the fascia with the bovie cautery. The fascia was incised in the midline with the scalpel and carried in a transverse curvilinear manner bilaterally.  The fascia was reflected superiorly and inferiorly off the rectus muscles and the muscles split in the midline.  A bowel free portion of the peritoneum was entered bluntly and then extended in a superior and inferior manner with good visualization of the bowel and bladder.  The Alexis instrument was then placed and the vesico-uterine fascia tented up and incised in a transverse curvilinear manner.  A 2 cm transverse incision was made in the upper portion of the lower uterine segment until the amnion was exposed.   The incision was  extended transversely in a blunt manner.  Clear fluid was noted and the baby delivered in the vertex presentation without complication.  The baby was bulb suctioned and the cord was clamped and cut aftet stripping blood from cord into baby.  The baby was then handed to awaiting Neonatology.  The placenta was then delivered manually and the uterus cleared of all debris.  The uterine incision was then closed with a running lock stitch of 0 monocryl.  An imbricating layer of 0 monocryl was closed as well. Excellent hemostasis of the uterine incision was achieved and the abdomen was cleared with irrigation.  The peritoneum was closed with a running stitch of 2-0 vicryl.  This incorporated the rectus muscles as a separate layer.  The fascia was then closed with a running stitch of 0 vicryl.  The subcutaneous layer was closed with interrupted  stitches of 2-0 plain gut.  The skin was closed with 4-0 vicryl on a Keith needle and steri-strips.  The patient tolerated the procedure well and was returned to the recovery room in stable condition.  All counts were correct times three.  Daniela Siebers A

## 2017-12-22 NOTE — Anesthesia Preprocedure Evaluation (Addendum)
Anesthesia Evaluation  Patient identified by MRN, date of birth, ID band Patient awake  General Assessment Comment:Pt ate meal at 1350 will delay C/S until 2000  Reviewed: Allergy & Precautions, NPO status , Patient's Chart, lab work & pertinent test results  Airway Mallampati: III       Dental no notable dental hx. (+) Teeth Intact   Pulmonary neg pulmonary ROS,    Pulmonary exam normal        Cardiovascular hypertension, Pt. on medications Normal cardiovascular exam     Neuro/Psych negative neurological ROS  negative psych ROS   GI/Hepatic GERD  ,  Endo/Other  Morbid obesity  Renal/GU      Musculoskeletal   Abdominal (+) + obese,   Peds  Hematology  (+) anemia ,   Anesthesia Other Findings   Reproductive/Obstetrics (+) Pregnancy                             Lab Results  Component Value Date   WBC 9.2 12/22/2017   HGB 10.0 (L) 12/22/2017   HCT 29.6 (L) 12/22/2017   MCV 78.3 12/22/2017   PLT 226 12/22/2017    Anesthesia Physical Anesthesia Plan  ASA: III  Anesthesia Plan: Spinal   Post-op Pain Management:    Induction:   PONV Risk Score and Plan: Treatment may vary due to age or medical condition  Airway Management Planned: Mask and Natural Airway  Additional Equipment:   Intra-op Plan:   Post-operative Plan:   Informed Consent:   Plan Discussed with: CRNA and Anesthesiologist  Anesthesia Plan Comments: (Due to NPO will begin after 2000)        Anesthesia Quick Evaluation

## 2017-12-22 NOTE — Brief Op Note (Signed)
12/22/2017  9:29 PM  PATIENT:  Laura Valentine  42 y.o. female  PRE-OPERATIVE DIAGNOSIS:  repeat cesarean/ desires sterilization; pre-eclampsia  POST-OPERATIVE DIAGNOSIS:  repeat cesarean/ desires sterilization;  pre-eclampsia  PROCEDURE:  Procedure(s): REPEAT CESAREAN SECTION WITH BILATERAL TUBAL LIGATION (N/A)  SURGEON:  Surgeon(s) and Role:    Carrington Clamp* Bryon Parker, MD - Primary  ANESTHESIA:   spinal  EBL:  549 mL   SPECIMEN:  No Specimen  DISPOSITION OF SPECIMEN:  N/A  COUNTS:  YES  TOURNIQUET:  * No tourniquets in log *  DICTATION: .Note written in EPIC  PLAN OF CARE: Admit to inpatient   PATIENT DISPOSITION:  PACU - hemodynamically stable.   Delay start of Pharmacological VTE agent (>24hrs) due to surgical blood loss or risk of bleeding: not applicable

## 2017-12-22 NOTE — H&P (Signed)
42 y.o.  G3P1011 [redacted]w[redacted]d comes in for evaluation of blood pressures.  Pt has had uncomplicated course to date except for being AMA.  NST R today in office but had BPs 140s-160s/80s.  Sent to MAU for eval and found to have severe range BPs requiring treatment.  Pt is for a repeat cesarean section at term.  Patient has good fetal movement and no bleeding. She will be admitted and undergo repeat c/s when Anesthesia says ok to.  Pt last ate fatty meal at 13:50 today.  Past Medical History:  Diagnosis Date  . Endometriosis   . GERD (gastroesophageal reflux disease)   . HSV (herpes simplex virus) anogenital infection   . Infection    UTI    Past Surgical History:  Procedure Laterality Date  . BREAST SURGERY Left    lumpectomy, benign  . CESAREAN SECTION    . TONSILLECTOMY      OB History  Gravida Para Term Preterm AB Living  3 1 1   1 1   SAB TAB Ectopic Multiple Live Births  1       1    # Outcome Date GA Lbr Len/2nd Weight Sex Delivery Anes PTL Lv  3 Current           2 Term 2011 [redacted]w[redacted]d  7 lb 12 oz (3.515 kg) M CS-LTranv   LIV  1 SAB             Social History   Socioeconomic History  . Marital status: Unknown    Spouse name: Not on file  . Number of children: Not on file  . Years of education: Not on file  . Highest education level: Not on file  Occupational History  . Not on file  Social Needs  . Financial resource strain: Not on file  . Food insecurity:    Worry: Not on file    Inability: Not on file  . Transportation needs:    Medical: Not on file    Non-medical: Not on file  Tobacco Use  . Smoking status: Never Smoker  . Smokeless tobacco: Never Used  Substance and Sexual Activity  . Alcohol use: Not Currently    Alcohol/week: 0.0 oz    Comment: once every blue moon  . Drug use: No  . Sexual activity: Not on file  Lifestyle  . Physical activity:    Days per week: Not on file    Minutes per session: Not on file  . Stress: Not on file  Relationships  . Social  connections:    Talks on phone: Not on file    Gets together: Not on file    Attends religious service: Not on file    Active member of club or organization: Not on file    Attends meetings of clubs or organizations: Not on file    Relationship status: Not on file  . Intimate partner violence:    Fear of current or ex partner: Not on file    Emotionally abused: Not on file    Physically abused: Not on file    Forced sexual activity: Not on file  Other Topics Concern  . Not on file  Social History Narrative  . Not on file   Penicillins; Latex; and Sulfa antibiotics   Prenatal Course: AMA.   Prenatal Transfer Tool  Maternal Diabetes: No Genetic Screening: Normal Maternal Ultrasounds/Referrals: Normal Fetal Ultrasounds or other Referrals:  None Maternal Substance Abuse:  No Significant Maternal Medications:  None Significant  Maternal Lab Results: None  Vitals:   12/22/17 1635 12/22/17 1645 12/22/17 1702  BP: (!) 158/90 (!) 166/97 (!) 174/96  Pulse: 98 99 (!) 102  Resp: 20    Temp: 98.4 F (36.9 C)    TempSrc: Oral    SpO2: 98%    Weight: 262 lb 12 oz (119.2 kg)    Height: 5\' 3"  (1.6 m)      Lungs/Cor:  NAD Abdomen:  soft, gravid Ex:  no cords, erythema SVE:  NA FHTs:  120s gSTV, NST R  Labs pending.  A/P 42 y.o. G3P1011 7827w2d  With severe preeclampsia.  Pt admitted for blood pressure management and preeclampsia management. HTN protocol, start magnesium sulfate. For repeat cesarean sectionat term.  Will do tonight when anesthesia ok with proceeding.  NPO for now. All risks, benefits and alternatives discussed with patient and she desires to proceed. Pt desires permanent sterilization- all risks d/w her and will do filschie clips with C/S.  Heavenleigh Petruzzi A

## 2017-12-22 NOTE — Progress Notes (Signed)
Called to PACU to give treatment on patient with reflux.  Patient in NAD, saturations 96-97% on room air.  Breath sounds pre and post treatment were equal and clear with great aeration.  No wheezes were present.  Tolerated well with no adverse effects.

## 2017-12-22 NOTE — Anesthesia Procedure Notes (Signed)
Spinal  Patient location during procedure: OB Start time: 12/22/2017 8:33 PM End time: 12/22/2017 8:43 PM Staffing Anesthesiologist: Trevor IhaHouser, Rosmery Duggin A, MD Performed: anesthesiologist  Preanesthetic Checklist Completed: patient identified, surgical consent, pre-op evaluation, timeout performed, IV checked, risks and benefits discussed and monitors and equipment checked Spinal Block Patient position: sitting Prep: site prepped and draped and DuraPrep Patient monitoring: heart rate, cardiac monitor, continuous pulse ox and blood pressure Approach: midline Location: L2-3 Injection technique: single-shot Needle Needle type: Pencan  Needle gauge: 24 G Needle length: 10 cm Needle insertion depth: 6 cm Assessment Sensory level: T4

## 2017-12-22 NOTE — MAU Note (Signed)
Urine in lab 

## 2017-12-22 NOTE — Transfer of Care (Signed)
Immediate Anesthesia Transfer of Care Note  Patient: Laura Valentine  Procedure(s) Performed: REPEAT CESAREAN SECTION WITH BILATERAL TUBAL LIGATION (N/A )  Patient Location: PACU  Anesthesia Type:Spinal  Level of Consciousness: awake, alert  and oriented  Airway & Oxygen Therapy: Patient Spontanous Breathing  Post-op Assessment: Report given to RN and Post -op Vital signs reviewed and stable  Post vital signs: Reviewed and stable  Last Vitals:  Vitals Value Taken Time  BP    Temp    Pulse 97 12/22/2017  9:46 PM  Resp    SpO2 97 % 12/22/2017  9:46 PM  Vitals shown include unvalidated device data.  Last Pain:  Vitals:   12/22/17 1912  TempSrc: Oral  PainSc:       Patients Stated Pain Goal: 0 (12/22/17 1829)  Complications: No apparent anesthesia complications

## 2017-12-22 NOTE — MAU Provider Note (Signed)
History     CSN: [id number]  Arrival date and time: 12/22/17 1554  Seen by provider at 5:00 pm    Chief Complaint  Patient presents with  . Hypertension   HPI Laura Valentine 42 y.o. [redacted]w[redacted]d Sent from the office today for evaluation with elevated BPs for preeclampsia evaluation.  On arrival, serial BPs begun.  Client has edema and has lost her voice for several months in this pregnancy.     OB History    Gravida  3   Para  1   Term  1   Preterm      AB  1   Living  1     SAB  1   TAB      Ectopic      Multiple      Live Births  1           Past Medical History:  Diagnosis Date  . Endometriosis   . GERD (gastroesophageal reflux disease)   . HSV (herpes simplex virus) anogenital infection   . Infection    UTI    Past Surgical History:  Procedure Laterality Date  . BREAST SURGERY Left    lumpectomy, benign  . CESAREAN SECTION    . TONSILLECTOMY      Family History  Problem Relation Age of Onset  . Diabetes Mother   . Diabetes Father   . Hypertension Father   . Stroke Father   . Congestive Heart Failure Father     Social History   Tobacco Use  . Smoking status: Never Smoker  . Smokeless tobacco: Never Used  Substance Use Topics  . Alcohol use: Not Currently    Alcohol/week: 0.0 oz    Comment: once every blue moon  . Drug use: No    Allergies:  Allergies  Allergen Reactions  . Penicillins Anaphylaxis    Has patient had a PCN reaction causing immediate rash, facial/tongue/throat swelling, SOB or lightheadedness with hypotension: Yes Has patient had a PCN reaction causing severe rash involving mucus membranes or skin necrosis: No Has patient had a PCN reaction that required hospitalization: No Has patient had a PCN reaction occurring within the last 10 years: No Childhood reaction. If all of the above answers are "NO", then may proceed with Cephalosporin use.   . Latex Other (See Comments)    Sores  . Sulfa Antibiotics  Other (See Comments)    Mouth Sores    Medications Prior to Admission  Medication Sig Dispense Refill Last Dose  . cetirizine (ZYRTEC) 10 MG tablet Take 10 mg by mouth at bedtime.   12/21/2017 at Unknown time  . omeprazole (PRILOSEC) 40 MG capsule Take 40 mg by mouth every evening.    12/21/2017 at Unknown time  . Prenatal Vit-Fe Fumarate-FA (PRENATAL MULTIVITAMIN) TABS tablet Take 1 tablet by mouth daily.   12/22/2017 at Unknown time  . valACYclovir (VALTREX) 500 MG tablet Take 500 mg by mouth daily.   12/22/2017 at Unknown time    Review of Systems  Constitutional: Negative for fever.  Cardiovascular: Positive for leg swelling.       Edema of hands noted  Gastrointestinal: Negative for abdominal pain, nausea and vomiting.  Genitourinary: Negative for vaginal bleeding and vaginal discharge.  Neurological: Negative for headaches.   Physical Exam   Blood pressure (!) 174/96, pulse (!) 102, temperature 98.4 F (36.9 C), temperature source Oral, resp. rate 20, height 5\' 3"  (1.6 m), weight 262 lb 12 oz (119.2 kg), last  menstrual period 03/29/2017, SpO2 98 %. Vitals:   12/22/17 1635 12/22/17 1645 12/22/17 1702  BP: (!) 158/90 (!) 166/97 (!) 174/96  Pulse: 98 99 (!) 102  Resp: 20    Temp: 98.4 F (36.9 C)    TempSrc: Oral    SpO2: 98%    Weight: 262 lb 12 oz (119.2 kg)    Height: 5\' 3"  (1.6 m)     Physical Exam  Nursing note and vitals reviewed. Constitutional: She is oriented to person, place, and time. She appears well-developed and well-nourished.  HENT:  Head: Normocephalic.  Client has severe laryngitis and speaks in a whisper.  Eyes: EOM are normal.  Neck: Neck supple.  GI: Soft. There is no tenderness. There is no rebound and no guarding.  No RUQ pain. FHT 135 baseline with moderate variability and 15x15 accels noted.  No decelerations.  Occasional mild contraction.  Reactive NST.  Musculoskeletal: Normal range of motion.  Ankle edema 3+ bilaterally.  Hands edematous   Neurological: She is alert and oriented to person, place, and time.  Skin: Skin is warm and dry.  Psychiatric: She has a normal mood and affect.    MAU Course  Procedures Results for orders placed or performed during the hospital encounter of 12/22/17 (from the past 24 hour(s))  CBC     Status: Abnormal   Collection Time: 12/22/17  4:59 PM  Result Value Ref Range   WBC 9.2 4.0 - 10.5 K/uL   RBC 3.78 (Valentine) 3.87 - 5.11 MIL/uL   Hemoglobin 10.0 (Valentine) 12.0 - 15.0 g/dL   HCT 78.229.6 (Valentine) 95.636.0 - 21.346.0 %   MCV 78.3 78.0 - 100.0 fL   MCH 26.5 26.0 - 34.0 pg   MCHC 33.8 30.0 - 36.0 g/dL   RDW 08.614.9 57.811.5 - 46.915.5 %   Platelets 226 150 - 400 K/uL   MDM Labs drawn and pending BP progressed to severe range even with client resting in bed and quiet.  Dr. Henderson CloudHorvath notified and severe range protocol begun.  [facility]    Assessment and Plan  Preeclampsia with Severe features Reactive NST  Laura Valentine Laura Valentine 12/22/2017, 5:04 PM

## 2017-12-22 NOTE — MAU Note (Addendum)
Sent from office, BP elevated.  For further eval. BP has been creeping last couple wks.

## 2017-12-22 NOTE — Progress Notes (Addendum)
Results for orders placed or performed during the hospital encounter of 12/22/17 (from the past 24 hour(s))  Protein / creatinine ratio, urine     Status: Abnormal   Collection Time: 12/22/17  4:27 PM  Result Value Ref Range   Creatinine, Urine 50.00 mg/dL   Total Protein, Urine 17 mg/dL   Protein Creatinine Ratio 0.34 (H) 0.00 - 0.15 mg/mg[Cre]  CBC     Status: Abnormal   Collection Time: 12/22/17  4:59 PM  Result Value Ref Range   WBC 9.2 4.0 - 10.5 K/uL   RBC 3.78 (L) 3.87 - 5.11 MIL/uL   Hemoglobin 10.0 (L) 12.0 - 15.0 g/dL   HCT 16.129.6 (L) 09.636.0 - 04.546.0 %   MCV 78.3 78.0 - 100.0 fL   MCH 26.5 26.0 - 34.0 pg   MCHC 33.8 30.0 - 36.0 g/dL   RDW 40.914.9 81.111.5 - 91.415.5 %   Platelets 226 150 - 400 K/uL  Comprehensive metabolic panel     Status: Abnormal   Collection Time: 12/22/17  4:59 PM  Result Value Ref Range   Sodium 137 135 - 145 mmol/L   Potassium 4.3 3.5 - 5.1 mmol/L   Chloride 107 98 - 111 mmol/L   CO2 21 (L) 22 - 32 mmol/L   Glucose, Bld 93 70 - 99 mg/dL   BUN 8 6 - 20 mg/dL   Creatinine, Ser 7.820.60 0.44 - 1.00 mg/dL   Calcium 9.0 8.9 - 95.610.3 mg/dL   Total Protein 5.7 (L) 6.5 - 8.1 g/dL   Albumin 2.6 (L) 3.5 - 5.0 g/dL   AST 24 15 - 41 U/L   ALT 14 0 - 44 U/L   Alkaline Phosphatase 155 (H) 38 - 126 U/L   Total Bilirubin 0.1 (L) 0.3 - 1.2 mg/dL   GFR calc non Af Amer >60 >60 mL/min   GFR calc Af Amer >60 >60 mL/min   Anion gap 9 5 - 15  Type and screen     Status: None   Collection Time: 12/22/17  4:59 PM  Result Value Ref Range   ABO/RH(D) O POS    Antibody Screen NEG    Sample Expiration      12/25/2017 Performed at [facility], [address] [place], [place] [address]   ABO/Rh     Status: None   Collection Time: 12/22/17  5:01 PM  Result Value Ref Range   ABO/RH(D)      O POS Performed at [facility], [address] [place], [place] [address]    Parents desire circ.

## 2017-12-23 ENCOUNTER — Inpatient Hospital Stay (HOSPITAL_COMMUNITY): Payer: Managed Care, Other (non HMO)

## 2017-12-23 ENCOUNTER — Other Ambulatory Visit: Payer: Self-pay

## 2017-12-23 ENCOUNTER — Encounter (HOSPITAL_COMMUNITY): Payer: Self-pay | Admitting: Obstetrics and Gynecology

## 2017-12-23 DIAGNOSIS — O1493 Unspecified pre-eclampsia, third trimester: Secondary | ICD-10-CM

## 2017-12-23 DIAGNOSIS — R579 Shock, unspecified: Secondary | ICD-10-CM

## 2017-12-23 DIAGNOSIS — G253 Myoclonus: Secondary | ICD-10-CM

## 2017-12-23 DIAGNOSIS — J9601 Acute respiratory failure with hypoxia: Secondary | ICD-10-CM

## 2017-12-23 DIAGNOSIS — R4182 Altered mental status, unspecified: Secondary | ICD-10-CM

## 2017-12-23 DIAGNOSIS — Z98891 History of uterine scar from previous surgery: Secondary | ICD-10-CM

## 2017-12-23 DIAGNOSIS — I469 Cardiac arrest, cause unspecified: Secondary | ICD-10-CM

## 2017-12-23 LAB — BLOOD GAS, ARTERIAL
ACID-BASE DEFICIT: 18 mmol/L — AB (ref 0.0–2.0)
ACID-BASE DEFICIT: 8 mmol/L — AB (ref 0.0–2.0)
BICARBONATE: 13.5 mmol/L — AB (ref 20.0–28.0)
BICARBONATE: 18.7 mmol/L — AB (ref 20.0–28.0)
FIO2: 1
FIO2: 1
O2 Saturation: 98.7 %
pCO2 arterial: 61.9 mmHg — ABNORMAL HIGH (ref 32.0–48.0)
pCO2 arterial: 44.7 mmHg (ref 32.0–48.0)
pH, Arterial: 6.969 — CL (ref 7.350–7.450)
pH, Arterial: 7.245 — ABNORMAL LOW (ref 7.350–7.450)
pO2, Arterial: 133 mmHg — ABNORMAL HIGH (ref 83.0–108.0)
pO2, Arterial: 139 mmHg — ABNORMAL HIGH (ref 83.0–108.0)

## 2017-12-23 LAB — COMPREHENSIVE METABOLIC PANEL
ALK PHOS: 170 U/L — AB (ref 38–126)
ALK PHOS: 112 U/L (ref 38–126)
ALT: 50 U/L — AB (ref 0–44)
ALT: 55 U/L — ABNORMAL HIGH (ref 0–44)
ALT: 39 U/L (ref 0–44)
ANION GAP: 21 — AB (ref 5–15)
AST: 75 U/L — ABNORMAL HIGH (ref 15–41)
AST: 77 U/L — AB (ref 15–41)
AST: 62 U/L — AB (ref 15–41)
Albumin: 2.4 g/dL — ABNORMAL LOW (ref 3.5–5.0)
Albumin: 2.1 g/dL — ABNORMAL LOW (ref 3.5–5.0)
Albumin: 2 g/dL — ABNORMAL LOW (ref 3.5–5.0)
Alkaline Phosphatase: 145 U/L — ABNORMAL HIGH (ref 38–126)
Anion gap: 8 (ref 5–15)
Anion gap: 8 (ref 5–15)
BILIRUBIN TOTAL: 0.4 mg/dL (ref 0.3–1.2)
BILIRUBIN TOTAL: 0.5 mg/dL (ref 0.3–1.2)
BILIRUBIN TOTAL: 0.6 mg/dL (ref 0.3–1.2)
BUN: 8 mg/dL (ref 6–20)
BUN: 8 mg/dL (ref 6–20)
BUN: 12 mg/dL (ref 6–20)
CALCIUM: 8.3 mg/dL — AB (ref 8.9–10.3)
CALCIUM: 7.4 mg/dL — AB (ref 8.9–10.3)
CHLORIDE: 106 mmol/L (ref 98–111)
CO2: 13 mmol/L — ABNORMAL LOW (ref 22–32)
CO2: 22 mmol/L (ref 22–32)
CO2: 24 mmol/L (ref 22–32)
CREATININE: 0.86 mg/dL (ref 0.44–1.00)
CREATININE: 0.81 mg/dL (ref 0.44–1.00)
Calcium: 7.6 mg/dL — ABNORMAL LOW (ref 8.9–10.3)
Chloride: 101 mmol/L (ref 98–111)
Chloride: 103 mmol/L (ref 98–111)
Creatinine, Ser: 0.89 mg/dL (ref 0.44–1.00)
GFR calc Af Amer: >60 mL/min (ref >60)
GFR calc non Af Amer: >60 mL/min (ref >60)
Glucose, Bld: 286 mg/dL — ABNORMAL HIGH (ref 70–99)
Glucose, Bld: 134 mg/dL — ABNORMAL HIGH (ref 70–99)
Glucose, Bld: 117 mg/dL — ABNORMAL HIGH (ref 70–99)
POTASSIUM: 5.8 mmol/L — AB (ref 3.5–5.1)
Potassium: 3.2 mmol/L — ABNORMAL LOW (ref 3.5–5.1)
Potassium: 4.3 mmol/L (ref 3.5–5.1)
Sodium: 135 mmol/L (ref 135–145)
Sodium: 136 mmol/L (ref 135–145)
Sodium: 135 mmol/L (ref 135–145)
TOTAL PROTEIN: 6.2 g/dL — AB (ref 6.5–8.1)
TOTAL PROTEIN: 5 g/dL — AB (ref 6.5–8.1)
Total Protein: 4.4 g/dL — ABNORMAL LOW (ref 6.5–8.1)

## 2017-12-23 LAB — POCT I-STAT 3, ART BLOOD GAS (G3+)
Acid-base deficit: 3 mmol/L — ABNORMAL HIGH (ref 0.0–2.0)
Bicarbonate: 23.4 mmol/L (ref 20.0–28.0)
Bicarbonate: 25.7 mmol/L (ref 20.0–28.0)
O2 SAT: 87 %
O2 Saturation: 100 %
PCO2 ART: 44.1 mmHg (ref 32.0–48.0)
PCO2 ART: 45.6 mmHg (ref 32.0–48.0)
PH ART: 7.328 — AB (ref 7.350–7.450)
PO2 ART: 212 mmHg — AB (ref 83.0–108.0)
Patient temperature: 36.1
TCO2: 25 mmol/L (ref 22–32)
TCO2: 27 mmol/L (ref 22–32)
pH, Arterial: 7.355 (ref 7.350–7.450)
pO2, Arterial: 54 mmHg — ABNORMAL LOW (ref 83.0–108.0)

## 2017-12-23 LAB — GLUCOSE, CAPILLARY
GLUCOSE-CAPILLARY: 134 mg/dL — AB (ref 70–99)
GLUCOSE-CAPILLARY: 94 mg/dL (ref 70–99)
Glucose-Capillary: 152 mg/dL — ABNORMAL HIGH (ref 70–99)
Glucose-Capillary: 125 mg/dL — ABNORMAL HIGH (ref 70–99)

## 2017-12-23 LAB — TRIGLYCERIDES: Triglycerides: 291 mg/dL — ABNORMAL HIGH (ref <150)

## 2017-12-23 LAB — CBC WITH DIFFERENTIAL/PLATELET
Abs Immature Granulocytes: 0.2 10*3/uL — ABNORMAL HIGH (ref 0.0–0.1)
BASOS PCT: 0 %
Basophils Absolute: 0 10*3/uL (ref 0.0–0.1)
EOS ABS: 0 10*3/uL (ref 0.0–0.7)
Eosinophils Relative: 0 %
HEMATOCRIT: 35.5 % — AB (ref 36.0–46.0)
Hemoglobin: 11.2 g/dL — ABNORMAL LOW (ref 12.0–15.0)
IMMATURE GRANULOCYTES: 1 %
LYMPHS ABS: 1 10*3/uL (ref 0.7–4.0)
Lymphocytes Relative: 5 %
MCH: 25 pg — AB (ref 26.0–34.0)
MCHC: 31.5 g/dL (ref 30.0–36.0)
MCV: 79.2 fL (ref 78.0–100.0)
MONOS PCT: 4 %
Monocytes Absolute: 0.8 10*3/uL (ref 0.1–1.0)
NEUTROS PCT: 90 %
Neutro Abs: 18.2 10*3/uL — ABNORMAL HIGH (ref 1.7–7.7)
PLATELETS: 260 10*3/uL (ref 150–400)
RBC: 4.48 MIL/uL (ref 3.87–5.11)
RDW: 14.6 % (ref 11.5–15.5)
WBC: 20.2 10*3/uL — ABNORMAL HIGH (ref 4.0–10.5)

## 2017-12-23 LAB — CBC
HEMATOCRIT: 41.1 % (ref 36.0–46.0)
HEMOGLOBIN: 13.1 g/dL (ref 12.0–15.0)
MCH: 25.9 pg — AB (ref 26.0–34.0)
MCHC: 31.9 g/dL (ref 30.0–36.0)
MCV: 81.2 fL (ref 78.0–100.0)
Platelets: 299 10*3/uL (ref 150–400)
RBC: 5.06 MIL/uL (ref 3.87–5.11)
RDW: 15.2 % (ref 11.5–15.5)
WBC: 13.7 10*3/uL — ABNORMAL HIGH (ref 4.0–10.5)

## 2017-12-23 LAB — BRAIN NATRIURETIC PEPTIDE: B Natriuretic Peptide: 182.1 pg/mL — ABNORMAL HIGH (ref 0.0–100.0)

## 2017-12-23 LAB — ECHOCARDIOGRAM COMPLETE
Height: 63 in
Weight: 4204 oz

## 2017-12-23 LAB — PHOSPHORUS
Phosphorus: 5.7 mg/dL — ABNORMAL HIGH (ref 2.5–4.6)
Phosphorus: 5.1 mg/dL — ABNORMAL HIGH (ref 2.5–4.6)

## 2017-12-23 LAB — LACTIC ACID, PLASMA
LACTIC ACID, VENOUS: 2.3 mmol/L — AB (ref 0.5–1.9)
Lactic Acid, Venous: 1.7 mmol/L (ref 0.5–1.9)

## 2017-12-23 LAB — TROPONIN I
TROPONIN I: 0.03 ng/mL — AB (ref <0.03)
TROPONIN I: 0.08 ng/mL — AB (ref <0.03)

## 2017-12-23 LAB — MAGNESIUM
MAGNESIUM: 2.9 mg/dL — AB (ref 1.7–2.4)
Magnesium: 4.1 mg/dL — ABNORMAL HIGH (ref 1.7–2.4)
Magnesium: 4.5 mg/dL — ABNORMAL HIGH (ref 1.7–2.4)

## 2017-12-23 LAB — RPR: RPR: NONREACTIVE

## 2017-12-23 MED ORDER — VALPROATE SODIUM 500 MG/5ML IV SOLN
500.0000 mg | Freq: Three times a day (TID) | INTRAVENOUS | Status: DC
  Administered 2017-12-24 – 2017-12-26 (×8): 500 mg via INTRAVENOUS
  Filled 2017-12-23 (×9): qty 5

## 2017-12-23 MED ORDER — DIPHENHYDRAMINE HCL 50 MG/ML IJ SOLN: 12.5000 mg | INTRAMUSCULAR | Status: DC | PRN

## 2017-12-23 MED ORDER — NALBUPHINE HCL 10 MG/ML IJ SOLN
5.0000 mg | Freq: Once | INTRAMUSCULAR | Status: DC | PRN
  Filled 2017-12-23: qty 0.5

## 2017-12-23 MED ORDER — FAMOTIDINE IN NACL 20-0.9 MG/50ML-% IV SOLN
20.0000 mg | Freq: Every day | INTRAVENOUS | Status: DC
  Administered 2017-12-23 – 2017-12-24 (×2): 20 mg via INTRAVENOUS
  Filled 2017-12-23 (×2): qty 50

## 2017-12-23 MED ORDER — MIDAZOLAM HCL 2 MG/2ML IJ SOLN
1.0000 mg | INTRAMUSCULAR | Status: DC | PRN
  Administered 2017-12-23 – 2017-12-26 (×7): 2 mg via INTRAVENOUS
  Filled 2017-12-23 (×8): qty 2

## 2017-12-23 MED ORDER — SODIUM CHLORIDE 0.9% FLUSH: 3.0000 mL | INTRAVENOUS | Status: DC | PRN

## 2017-12-23 MED ORDER — FENTANYL CITRATE (PF) 100 MCG/2ML IJ SOLN
50.0000 ug | Freq: Once | INTRAMUSCULAR | Status: AC
  Administered 2017-12-23: 50 ug via INTRAVENOUS

## 2017-12-23 MED ORDER — LEVETIRACETAM IN NACL 1500 MG/100ML IV SOLN
1500.0000 mg | Freq: Once | INTRAVENOUS | Status: AC
  Administered 2017-12-23: 1500 mg via INTRAVENOUS
  Filled 2017-12-23: qty 100

## 2017-12-23 MED ORDER — SODIUM CHLORIDE 0.9 % IV SOLN
INTRAVENOUS | Status: DC | PRN
  Administered 2017-12-26: 12:00:00 via INTRA_ARTERIAL

## 2017-12-23 MED ORDER — PROPOFOL 1000 MG/100ML IV EMUL
150.0000 ug/kg/min | INTRAVENOUS | Status: DC
  Administered 2017-12-23: 20 ug/kg/min via INTRAVENOUS
  Administered 2017-12-23: 50 ug/kg/min via INTRAVENOUS
  Administered 2017-12-23: 25 ug/kg/min via INTRAVENOUS
  Administered 2017-12-23: 40 ug/kg/min via INTRAVENOUS
  Administered 2017-12-24 – 2017-12-25 (×15): 70 ug/kg/min via INTRAVENOUS
  Administered 2017-12-26 (×2): 130 ug/kg/min via INTRAVENOUS
  Administered 2017-12-26: 110 ug/kg/min via INTRAVENOUS
  Administered 2017-12-26: 90 ug/kg/min via INTRAVENOUS
  Administered 2017-12-26 (×2): 130 ug/kg/min via INTRAVENOUS
  Administered 2017-12-26: 20 ug/kg/min via INTRAVENOUS
  Administered 2017-12-26: 50 ug/kg/min via INTRAVENOUS
  Administered 2017-12-26: 20 ug/kg/min via INTRAVENOUS
  Administered 2017-12-27 (×3): 150 ug/kg/min via INTRAVENOUS
  Administered 2017-12-27: 130 ug/kg/min via INTRAVENOUS
  Administered 2017-12-27 (×6): 150 ug/kg/min via INTRAVENOUS
  Filled 2017-12-23 (×8): qty 100
  Filled 2017-12-23: qty 400
  Filled 2017-12-23 (×9): qty 100
  Filled 2017-12-23: qty 300
  Filled 2017-12-23 (×11): qty 100
  Filled 2017-12-23: qty 200
  Filled 2017-12-23: qty 100

## 2017-12-23 MED ORDER — MAGNESIUM SULFATE BOLUS VIA INFUSION
4.0000 g | Freq: Once | INTRAVENOUS | Status: AC
  Administered 2017-12-23: 4 g via INTRAVENOUS
  Filled 2017-12-23: qty 500

## 2017-12-23 MED ORDER — SODIUM CHLORIDE 0.9 % IV SOLN
2.0000 g | Freq: Three times a day (TID) | INTRAVENOUS | Status: DC
  Administered 2017-12-23 – 2017-12-24 (×2): 2 g via INTRAVENOUS
  Filled 2017-12-23 (×3): qty 2

## 2017-12-23 MED ORDER — DIBUCAINE 1 % RE OINT
1.0000 "application " | TOPICAL_OINTMENT | RECTAL | Status: DC | PRN
  Filled 2017-12-23: qty 28

## 2017-12-23 MED ORDER — KETOROLAC TROMETHAMINE 30 MG/ML IJ SOLN: 30.0000 mg | Freq: Four times a day (QID) | INTRAMUSCULAR | Status: DC | PRN

## 2017-12-23 MED ORDER — FUROSEMIDE 10 MG/ML IJ SOLN
20.0000 mg | Freq: Once | INTRAMUSCULAR | Status: AC
  Administered 2017-12-23: 20 mg via INTRAVENOUS

## 2017-12-23 MED ORDER — HEPARIN SODIUM (PORCINE) 5000 UNIT/ML IJ SOLN: 5000.0000 [IU] | Freq: Three times a day (TID) | INTRAMUSCULAR | Status: DC

## 2017-12-23 MED ORDER — FENTANYL 2500MCG IN NS 250ML (10MCG/ML) PREMIX INFUSION
25.0000 ug/h | INTRAVENOUS | Status: DC
  Administered 2017-12-23: 175 ug/h via INTRAVENOUS
  Administered 2017-12-23 – 2017-12-25 (×3): 150 ug/h via INTRAVENOUS
  Filled 2017-12-23 (×4): qty 250

## 2017-12-23 MED ORDER — SODIUM CHLORIDE 0.9 % IV SOLN
INTRAVENOUS | Status: DC | PRN
  Administered 2017-12-23 – 2017-12-30 (×3): via INTRAVENOUS

## 2017-12-23 MED ORDER — FUROSEMIDE 10 MG/ML IJ SOLN
20.0000 mg | Freq: Two times a day (BID) | INTRAMUSCULAR | Status: AC | PRN
  Administered 2017-12-23 (×2): 20 mg via INTRAVENOUS

## 2017-12-23 MED ORDER — NALBUPHINE HCL 10 MG/ML IJ SOLN
5.0000 mg | INTRAMUSCULAR | Status: DC | PRN
  Filled 2017-12-23: qty 0.5

## 2017-12-23 MED ORDER — MIDAZOLAM HCL 2 MG/2ML IJ SOLN
INTRAMUSCULAR | Status: AC
  Filled 2017-12-23: qty 2

## 2017-12-23 MED ORDER — MEASLES, MUMPS & RUBELLA VAC ~~LOC~~ INJ
0.5000 mL | INJECTION | Freq: Once | SUBCUTANEOUS | Status: DC
  Filled 2017-12-23: qty 0.5

## 2017-12-23 MED ORDER — SCOPOLAMINE 1 MG/3DAYS TD PT72
1.0000 | MEDICATED_PATCH | Freq: Once | TRANSDERMAL | Status: DC
  Filled 2017-12-23: qty 1

## 2017-12-23 MED ORDER — MIDAZOLAM HCL 2 MG/2ML IJ SOLN
INTRAMUSCULAR | Status: AC
  Administered 2017-12-23: 09:00:00
  Filled 2017-12-23: qty 2

## 2017-12-23 MED ORDER — VALPROATE SODIUM 500 MG/5ML IV SOLN
1500.0000 mg | Freq: Once | INTRAVENOUS | Status: AC
  Administered 2017-12-24: 1500 mg via INTRAVENOUS
  Filled 2017-12-23: qty 15

## 2017-12-23 MED ORDER — WITCH HAZEL-GLYCERIN EX PADS
1.0000 "application " | MEDICATED_PAD | CUTANEOUS | Status: DC | PRN
  Filled 2017-12-23: qty 100

## 2017-12-23 MED ORDER — CHLORHEXIDINE GLUCONATE 0.12% ORAL RINSE (MEDLINE KIT)
15.0000 mL | Freq: Two times a day (BID) | OROMUCOSAL | Status: DC
  Administered 2017-12-23 – 2018-01-02 (×19): 15 mL via OROMUCOSAL

## 2017-12-23 MED ORDER — FENTANYL BOLUS VIA INFUSION
50.0000 ug | INTRAVENOUS | Status: DC | PRN
  Administered 2017-12-27 (×2): 50 ug via INTRAVENOUS
  Filled 2017-12-23: qty 50

## 2017-12-23 MED ORDER — HEPARIN SODIUM (PORCINE) 5000 UNIT/ML IJ SOLN
5000.0000 [IU] | Freq: Three times a day (TID) | INTRAMUSCULAR | Status: DC
  Administered 2017-12-23 – 2018-01-02 (×30): 5000 [IU] via SUBCUTANEOUS
  Filled 2017-12-23 (×31): qty 1

## 2017-12-23 MED ORDER — INSULIN ASPART 100 UNIT/ML IV SOLN
5.0000 [IU] | Freq: Once | INTRAVENOUS | Status: AC
  Administered 2017-12-23: 5 [IU] via INTRAVENOUS

## 2017-12-23 MED ORDER — TETANUS-DIPHTH-ACELL PERTUSSIS 5-2.5-18.5 LF-MCG/0.5 IM SUSP
0.5000 mL | Freq: Once | INTRAMUSCULAR | Status: DC
  Filled 2017-12-23: qty 0.5

## 2017-12-23 MED ORDER — IOPAMIDOL (ISOVUE-370) INJECTION 76%
100.0000 mL | Freq: Once | INTRAVENOUS | Status: AC | PRN
  Administered 2017-12-23: 100 mL via INTRAVENOUS

## 2017-12-23 MED ORDER — ONDANSETRON HCL 4 MG/2ML IJ SOLN: 4.0000 mg | Freq: Three times a day (TID) | INTRAMUSCULAR | Status: DC | PRN

## 2017-12-23 MED ORDER — IOPAMIDOL (ISOVUE-370) INJECTION 76%
INTRAVENOUS | Status: AC
  Administered 2017-12-23: 10:00:00
  Filled 2017-12-23: qty 100

## 2017-12-23 MED ORDER — LEVETIRACETAM IN NACL 1000 MG/100ML IV SOLN
1000.0000 mg | Freq: Two times a day (BID) | INTRAVENOUS | Status: DC
  Administered 2017-12-24 – 2017-12-26 (×6): 1000 mg via INTRAVENOUS
  Filled 2017-12-23 (×7): qty 100

## 2017-12-23 MED ORDER — SODIUM CHLORIDE 0.9 % IV SOLN
0.5000 mg/h | INTRAVENOUS | Status: DC
  Filled 2017-12-23: qty 10

## 2017-12-23 MED ORDER — FENTANYL CITRATE (PF) 100 MCG/2ML IJ SOLN
100.0000 ug | Freq: Once | INTRAMUSCULAR | Status: AC
  Administered 2017-12-26: 100 ug via INTRAVENOUS

## 2017-12-23 MED ORDER — PHENYLEPHRINE HCL-NACL 10-0.9 MG/250ML-% IV SOLN
0.0000 ug/min | INTRAVENOUS | Status: DC
  Administered 2017-12-23: 25 ug/min via INTRAVENOUS
  Administered 2017-12-24: 200 ug/min via INTRAVENOUS
  Administered 2017-12-24: 175 ug/min via INTRAVENOUS
  Administered 2017-12-24: 100 ug/min via INTRAVENOUS
  Administered 2017-12-24 (×2): 200 ug/min via INTRAVENOUS
  Administered 2017-12-24: 100 ug/min via INTRAVENOUS
  Administered 2017-12-24: 300 ug/min via INTRAVENOUS
  Administered 2017-12-24: 200 ug/min via INTRAVENOUS
  Administered 2017-12-24: 30 ug/min via INTRAVENOUS
  Filled 2017-12-23 (×13): qty 250

## 2017-12-23 MED ORDER — METHYLERGONOVINE MALEATE 0.2 MG/ML IJ SOLN
0.2000 mg | INTRAMUSCULAR | Status: DC | PRN
  Filled 2017-12-23: qty 1

## 2017-12-23 MED ORDER — VANCOMYCIN HCL IN DEXTROSE 1-5 GM/200ML-% IV SOLN
1000.0000 mg | Freq: Three times a day (TID) | INTRAVENOUS | Status: DC
  Administered 2017-12-23 – 2017-12-25 (×6): 1000 mg via INTRAVENOUS
  Filled 2017-12-23 (×9): qty 200

## 2017-12-23 MED ORDER — NALOXONE HCL 4 MG/10ML IJ SOLN
1.0000 ug/kg/h | INTRAVENOUS | Status: DC | PRN
  Filled 2017-12-23: qty 5

## 2017-12-23 MED ORDER — METRONIDAZOLE IN NACL 5-0.79 MG/ML-% IV SOLN
500.0000 mg | Freq: Three times a day (TID) | INTRAVENOUS | Status: DC
  Administered 2017-12-23 – 2017-12-24 (×2): 500 mg via INTRAVENOUS
  Filled 2017-12-23 (×3): qty 100

## 2017-12-23 MED ORDER — ALBUTEROL SULFATE (2.5 MG/3ML) 0.083% IN NEBU
2.5000 mg | INHALATION_SOLUTION | Freq: Four times a day (QID) | RESPIRATORY_TRACT | Status: DC
  Administered 2017-12-23 – 2018-01-02 (×41): 2.5 mg via RESPIRATORY_TRACT
  Filled 2017-12-23 (×40): qty 3

## 2017-12-23 MED ORDER — SODIUM BICARBONATE 8.4 % IV SOLN
50.0000 meq | Freq: Once | INTRAVENOUS | Status: AC
  Administered 2017-12-23: 50 meq via INTRAVENOUS

## 2017-12-23 MED ORDER — DIPHENHYDRAMINE HCL 25 MG PO CAPS: 25.0000 mg | ORAL_CAPSULE | ORAL | Status: DC | PRN

## 2017-12-23 MED ORDER — EPINEPHRINE PF 1 MG/10ML IJ SOSY: 0.1000 mg | PREFILLED_SYRINGE | INTRAMUSCULAR | Status: DC | PRN

## 2017-12-23 MED ORDER — FENTANYL CITRATE (PF) 100 MCG/2ML IJ SOLN
INTRAMUSCULAR | Status: AC
  Administered 2017-12-23: 100 ug
  Filled 2017-12-23: qty 2

## 2017-12-23 MED ORDER — COCONUT OIL OIL
1.0000 "application " | TOPICAL_OIL | Status: DC | PRN
  Administered 2017-12-24: 1 via TOPICAL
  Filled 2017-12-23: qty 120

## 2017-12-23 MED ORDER — VANCOMYCIN HCL 10 G IV SOLR: 1500.0000 mg | INTRAVENOUS | Status: DC

## 2017-12-23 MED ORDER — INSULIN REGULAR BOLUS VIA INFUSION: 5.0000 [IU] | Freq: Once | INTRAVENOUS | Status: DC

## 2017-12-23 MED ORDER — ORAL CARE MOUTH RINSE
15.0000 mL | OROMUCOSAL | Status: DC
  Administered 2017-12-23 – 2018-01-02 (×82): 15 mL via OROMUCOSAL

## 2017-12-23 MED ORDER — NALOXONE HCL 0.4 MG/ML IJ SOLN: 0.4000 mg | INTRAMUSCULAR | Status: DC | PRN

## 2017-12-23 MED ORDER — LABETALOL HCL 5 MG/ML IV SOLN
INTRAVENOUS | Status: AC
  Administered 2017-12-23: 07:00:00
  Filled 2017-12-23: qty 4

## 2017-12-23 MED ORDER — METHYLERGONOVINE MALEATE 0.2 MG PO TABS
0.2000 mg | ORAL_TABLET | ORAL | Status: DC | PRN
  Filled 2017-12-23: qty 1

## 2017-12-23 MED ORDER — MIDAZOLAM HCL 2 MG/2ML IJ SOLN
2.0000 mg | Freq: Once | INTRAMUSCULAR | Status: AC
  Administered 2017-12-23: 2 mg via INTRAVENOUS
  Filled 2017-12-23: qty 2

## 2017-12-23 MED ORDER — CLONAZEPAM 1 MG PO TABS
1.0000 mg | ORAL_TABLET | Freq: Three times a day (TID) | ORAL | Status: DC | PRN
  Administered 2017-12-24: 1 mg via ORAL
  Filled 2017-12-23: qty 1

## 2017-12-23 MED ORDER — MAGNESIUM SULFATE 2 GM/50ML IV SOLN: 2.0000 g | Freq: Once | INTRAVENOUS | Status: DC

## 2017-12-23 MED ORDER — MIDAZOLAM HCL 2 MG/2ML IJ SOLN
INTRAMUSCULAR | Status: AC
  Administered 2017-12-23: 07:00:00
  Filled 2017-12-23: qty 2

## 2017-12-23 MED ORDER — DEXTROSE 50 % IV SOLN
50.0000 mL | Freq: Once | INTRAVENOUS | Status: AC
  Administered 2017-12-23: 50 mL via INTRAVENOUS
  Filled 2017-12-23: qty 50

## 2017-12-23 MED ORDER — MAGNESIUM SULFATE 40 G IN LACTATED RINGERS - SIMPLE
2.0000 g/h | INTRAVENOUS | Status: DC
  Administered 2017-12-23 – 2017-12-25 (×3): 2 g/h via INTRAVENOUS
  Filled 2017-12-23: qty 500
  Filled 2017-12-23: qty 50
  Filled 2017-12-23 (×2): qty 500

## 2017-12-23 MED FILL — Medication: Qty: 1 | Status: AC

## 2017-12-23 NOTE — Progress Notes (Signed)
Patient having continuous seizure-like activity. Dr. Otelia LimesLindzen Neurology at bedside. 2 mg Versed given, activity lessened but still present. CCMD Icard notified. Vital signs stable throughout. Orders for STAT MRI received. Per radiology staff, MRI not available at this time. Will bring patient as scanner becomes available. Will continue to closely monitor.

## 2017-12-23 NOTE — Lactation Note (Signed)
This note was copied from a baby's chart. Lactation Consultation Note Baby 3 hrs old. Mom had a room full of company. Explained to mom LC would come back later to see her. Brochure left at bedside Patient Name: Boy Dorene GrebeKimberly Pinckney EAVWU'JToday's Date: 12/23/2017     Maternal Data    Feeding    LATCH Score                   Interventions    Lactation Tools Discussed/Used     Consult Status      Charyl DancerCARVER, Anetta Olvera G 12/23/2017, 12:43 AM

## 2017-12-23 NOTE — Progress Notes (Addendum)
Neurology Progress Note   S:// Exhibiting myoclonic movements with stimulus. cEEG being hooked up at this time.  O:// Current vital signs: BP (!) 111/58   Pulse 81   Temp 98.6 F (37 C) (Oral)   Resp 17   Ht 5\' 3"  (1.6 m)   Wt 117.8 kg (259 lb 11.2 oz)   LMP 03/29/2017   SpO2 99%   Breastfeeding? Unknown   BMI 46.00 kg/m  Vital signs in last 24 hours: Temp:  [97.5 F (36.4 C)-98.8 F (37.1 C)] 98.6 F (37 C) (08/02 1522) Pulse Rate:  [71-144] 81 (08/02 2245) Resp:  [11-27] 17 (08/02 2245) BP: (73-203)/(44-168) 111/58 (08/02 2245) SpO2:  [93 %-100 %] 99 % (08/02 2245) Arterial Line BP: (71-109)/(61-99) 105/99 (08/02 2045) FiO2 (%):  [50 %-100 %] 50 % (08/02 2000) Weight:  [117.8 kg (259 lb 11.2 oz)] 117.8 kg (259 lb 11.2 oz) (08/02 0900) Sedated intubated No spontaneous murmurs. Has intermittent myoclonic jerking movements, which are more frequent with stiffness. Pupils equal round reactive Corneals present Unable to elicit doll's eyes Weak cough and gag No response to noxious stim.  Imaging I have reviewed images in epic and the results pertinent to this consultation are: MRI brain does not show any acute changes.  I reviewed the continuous video EEG. Multiple myoclonic bursts.  Possible small runs of suspected seizures as well. Formal read pending.  Differentials: Myoclonic status Anoxic brain injury  Eclampsia  Posterior reversible encephalopathy syndrome (PRES) Primary seizures disorder leading to status epilepticus  Recommendations: Increase propofol to 50/h Loaded with Depakote 1500 mg IV x1.  Start Depakote 500 3 times daily from tomorrow. Klonopin 1 mg every 8 hours as needed for myoclonic jerking. Neurology will continue to follow with you. I updated the family including the husband at bedside and answered all the questions.  -- Milon DikesAshish Emilene Roma, MD [facility] Pager: [phone] If 7pm to 7am, please call on call as listed on  AMION. CRITICAL CARE ATTESTATION This patient is critically ill and at significant risk of neurological worsening, death and care requires constant monitoring of vital signs, hemodynamics,respiratory and cardiac monitoring. I spent 40  minutes of neurocritical care time performing neurological assessment, discussion with family, other specialists and medical decision making of high complexityin the care of  this patient.

## 2017-12-23 NOTE — Progress Notes (Signed)
   12/23/17 1400  Clinical Encounter Type  Visited With Patient and family together  Visit Type Critical Care  Spiritual Encounters  Spiritual Needs Emotional;Prayer  Stress Factors  Family Stress Factors Health changes   Was alerted about this patient and family.  [facility] and was [facility] here this morning.  Family at bedside and others in the waiting area.  The patient has an son who is 8.  Husband is going back and forth to be with the newborn son.  Theda BelfastBob Hamilton has been in touch with [facility] staff.  I gathered the family and we prayed together.  Will be sure night Chaplain checks in with them. Will follow and support as needed. Chaplain Agustin CreeNewton Mayrani Khamis

## 2017-12-23 NOTE — Anesthesia Postprocedure Evaluation (Signed)
Anesthesia Post Note  Patient: Dorene GrebeKimberly Jarvie  Procedure(s) Performed: REPEAT CESAREAN SECTION WITH BILATERAL TUBAL LIGATION (N/A )     Patient location during evaluation: PACU Anesthesia Type: Spinal Level of consciousness: oriented and awake and alert Pain management: pain level controlled Vital Signs Assessment: post-procedure vital signs reviewed and stable Respiratory status: spontaneous breathing, respiratory function stable and patient connected to nasal cannula oxygen Cardiovascular status: blood pressure returned to baseline and stable Postop Assessment: no headache, no backache and no apparent nausea or vomiting Anesthetic complications: no    Last Vitals:  Vitals:   12/22/17 2346 12/23/17 0055  BP: (!) 149/86 138/88  Pulse: 88 83  Resp: 18 17  Temp: (!) 36.4 C   SpO2:      Last Pain:  Vitals:   12/23/17 0055  TempSrc:   PainSc: 2    Pain Goal: Patients Stated Pain Goal: 0 (12/22/17 1829)               Trevor IhaStephen A Anayi Bricco

## 2017-12-23 NOTE — Addendum Note (Signed)
Addendum  created 12/23/17 2114 by Marcene DuosFitzgerald, Kellyn Mccary, MD   Sign clinical note

## 2017-12-23 NOTE — Progress Notes (Signed)
vLTM EEG running. No skin breakdown. Neuro notified 

## 2017-12-23 NOTE — Progress Notes (Signed)
ANTIBIOTIC CONSULT NOTE - INITIAL  Pharmacy Consult for Vancomycin Indication: sepsis, aspiration  Allergies  Allergen Reactions  . Penicillins Anaphylaxis    Has patient had a PCN reaction causing immediate rash, facial/tongue/throat swelling, SOB or lightheadedness with hypotension: Yes Has patient had a PCN reaction causing severe rash involving mucus membranes or skin necrosis: No Has patient had a PCN reaction that required hospitalization: No Has patient had a PCN reaction occurring within the last 10 years: No Childhood reaction. If all of the above answers are "NO", then may proceed with Cephalosporin use.   . Latex Other (See Comments)    Sores  . Sulfa Antibiotics Other (See Comments)    Mouth Sores    Patient Measurements: Height: 5\' 3"  (160 cm) Weight: 259 lb 11.2 oz (117.8 kg) IBW/kg (Calculated) : 52.4 Adjusted Body Weight:   Vital Signs: Temp: 98.6 F (37 C) (08/02 1522) Temp Source: Oral (08/02 1522) BP: 103/58 (08/02 1600) Pulse Rate: 80 (08/02 1600) Intake/Output from previous day: 08/01 0701 - 08/02 0700 In: 1965 [P.O.:840; I.V.:1125] Out: 3824 [Urine:3125; Blood:549] Intake/Output from this shift: Total I/O In: 799 [I.V.:699; IV Piggyback:100] Out: 910 [Urine:700; Other:210]  Labs: Recent Labs    12/22/17 1627 12/22/17 1659 12/23/17 0600 12/23/17 0903  WBC  --  9.2 13.7* 20.2*  HGB  --  10.0* 13.1 11.2*  PLT  --  226 299 260  LABCREA 50.00  --   --   --   CREATININE  --  0.60 0.86 0.89   Estimated Creatinine Clearance: 104.3 mL/min (by C-G formula based on SCr of 0.89 mg/dL). No results for input(s): VANCOTROUGH, VANCOPEAK, VANCORANDOM, GENTTROUGH, GENTPEAK, GENTRANDOM, TOBRATROUGH, TOBRAPEAK, TOBRARND, AMIKACINPEAK, AMIKACINTROU, AMIKACIN in the last 72 hours.   Microbiology: Recent Results (from the past 720 hour(s))  OB RESULT CONSOLE Group B Strep     Status: None   Collection Time: 12/08/17 12:00 AM  Result Value Ref Range Status    GBS Negative  Final    Medical History: Past Medical History:  Diagnosis Date  . Endometriosis   . GERD (gastroesophageal reflux disease)   . HSV (herpes simplex virus) anogenital infection   . Infection    UTI   Assessment: CC/HPI: 42 yo f s/p c-section at 6838 w d/t pre-eclampsia 8/1; found lips blue unconscious 8/2 am requiring 1 shock and subsequently intubated  PMH: endometriosis, GERD, HSV  ID: r/o aspiration and sepsis. WBC 13.7>20.2, AF. CrCl>100  Cefepime 8/2>> Flagyl 8/2>> Vanco 8/2>>  8/2 blood x 2  Goal of Therapy:  Vancomycin trough level 15-20 mcg/ml  Plan:  Add Cefepime 1g IV q 8 hrs (tolerated Keflex in past) Flagyl 500mg  IV q 8 hrs Vancomycin 1g IV q  8hrs. Trough after 3-5 doses at steady state.  Willia Genrich S. Merilynn Finlandobertson, PharmD, BCPS Clinical Staff Pharmacist Pager [phone]  Misty Stanleyobertson, Osaze Hubbert Stillinger 12/23/2017,4:52 PM

## 2017-12-23 NOTE — Progress Notes (Signed)
PCCM:  Documented PCN allergy. Per mother at bedside the patient has tolerated keflex in the past.  Broad spectrum abx initiated in the setting of aspiration event and possible developing sepsis.  Cefepime, vanco and metronidazole.   Josephine IgoBradley L Sheridan Hew, DO [facility] 12/23/2017 4:29 PM  Pager: [phone]

## 2017-12-23 NOTE — Procedures (Signed)
ELECTROENCEPHALOGRAM REPORT   Patient: Laura Valentine       [facility] EEG No. ID: [id number] Age: 42 y.o.        Sex: female Referring Physician: Henderson CloudHorvath Report Date:  12/23/2017        Interpreting Physician: Thana FarrEYNOLDS, Drae Mitzel  History: Laura Valentine is an 42 y.o. female s/p arrest  Medications:  Albuterol, Pepcid, Fentanyl, Claritin, Magnesium sulfate, MVI, Senokot, Propofol, Neosynephrine  Conditions of Recording:  This is a 21 channel routine scalp EEG performed with bipolar and monopolar montages arranged in accordance to the international 10/20 system of electrode placement. One channel was dedicated to EKG recording.  The patient is in the intubated and sedated state.  Description:  Conditions of Recording: This is a prolonged 16 channel EEG carried out with concomitant video monitoring from 07/22/2013 at 0730 to 07/22/2013 at 0348. The patient is in the intubated and sedated state.   Description:  The background activity is discontinuous. It consists of a diffuse, markedly attenuated background rhythm.  This is persistent throughout the recording.  There are infrequent intervening generalized bursts of polyspike, spike and slow wave activity.  These bursts are short-lived lasting no longer than one second.  On two occasions the burst is associated with a myoclonic jerk but they occur most commonly without any clinical change noted.   Attempts were made at stimulation of the patient without any reactivity of the electroencephalogram noted.   Hyperventilation and intermittent photic stimulation were not performed.  IMPRESSION: This is an abnormal electroencephalogram secondary to a burst suppression rhythm that is dominated by suppression.  There were noted two episodes of myoclonus, both associated with burst activity.  There were multiple other burst episodes without clincal correlate.     Thana FarrLeslie Jamilyn Pigeon, MD Neurology [phone] 12/23/2017, 1:47 PM

## 2017-12-23 NOTE — Care Management Note (Addendum)
Case Management Note  Patient Details  Name: Laura Valentine MRN: [id number] Date of Birth: Sep 30, 1975  Subjective/Objective:  From home with spouse, [facility]  severe preeclampsia and underwent C-section at 38 weeks and 2 days.  She has a past medical history of asthma and endometriosis.  A few hours postop at approximately 5 AM she complained  of difficulty breathing.  Upon return to the room by nursing staff she was found unresponsive with her lips blue difficulty breathing.     8/12 Laura Capeeborah Demarques Pilz RN, BSN - [facility]              Action/Plan: NCM will follow for transition of care needs.  Expected Discharge Date:                  Expected Discharge Plan:     In-House Referral:     Discharge planning Services  CM Consult  Post Acute Care Choice:    Choice offered to:     DME Arranged:    DME Agency:     HH Arranged:    HH Agency:     Status of Service:  In process, will continue to follow  If discussed at Long Length of Stay Meetings, dates discussed:    Additional Comments:  Laura Valentine, Laura Linson Clinton, RN 12/23/2017, 12:45 PM

## 2017-12-23 NOTE — Addendum Note (Signed)
Addendum  created 12/23/17 2108 by Trevor IhaHouser, Paulita Licklider A, MD   Intraprocedure Event deleted, Intraprocedure Event edited

## 2017-12-23 NOTE — H&P (Addendum)
PULMONARY / CRITICAL CARE MEDICINE   Name: Laura Valentine MRN: [id number] DOB: 1975/06/08    ADMISSION DATE:  12/22/2017 CONSULTATION DATE: 12/23/2017  REFERRING MD: Dr. Henderson Cloud  CHIEF COMPLAINT: Post cardiac arrest  HISTORY OF PRESENT ILLNESS:   42 year old G3 P1-0-1-1 38 weeks 2 days who presented on 12/22/2017 at 5:06 PM with hypertension.  She underwent repeat C-section at that time.  Did have good fetal movement and no bleeding.  She is admitted at that time. 12/23/2017 0500 hrs. she complained of difficulty breathing.  Nurse returned to room and found her blue lips.  She has stopped breathing for to be without pulse and was shocked x1 for an unknown rhythm.  She did receive CPR epinephrine and atropine.  He became essentially hypertensive for.  And hypotensive requiring Neo-Synephrine drip.  She had elevated troponins.  She was packaged and transported by the mobile critical care service to [facility] medical intensive care unit on 3 AM on 4. On arrival she was noted to be asynchronous with the vent, did not follow commands.  She was on a propofol drip and Neo-Synephrine drip.  She had a left radial arterial line in place and 2 peripheral IVs in place. Pulmonary critical care was asked to admit.  She was admitted to the intensive care unit consult was placed to cardiology along with a stat 2D echo, arterial blood gases, chest x-ray which showed good placement of endotracheal tube perihilar edema.  Multiple changes were made to mechanical ventilatory support to increase oxygenation.  There was a concern for pulmonary embolism therefore CT Angio of the chest will be ordered. The concerns here are; questionable PE, questionable press, questionable respiratory arrest secondary for exacerbation of asthma, questionable aspiration, questionable interventricular process leading to cardiac arrest.. Her past medical history as been documented. Reported approximately 5 minutes of downtime  with return of spontaneous circulation.  PAST MEDICAL HISTORY :  She  has a past medical history of Endometriosis, GERD (gastroesophageal reflux disease), HSV (herpes simplex virus) anogenital infection, and Infection.  PAST SURGICAL HISTORY: She  has a past surgical history that includes Tonsillectomy; Cesarean section; and Breast surgery (Left).  Allergies  Allergen Reactions  . Penicillins Anaphylaxis    Has patient had a PCN reaction causing immediate rash, facial/tongue/throat swelling, SOB or lightheadedness with hypotension: Yes Has patient had a PCN reaction causing severe rash involving mucus membranes or skin necrosis: No Has patient had a PCN reaction that required hospitalization: No Has patient had a PCN reaction occurring within the last 10 years: No Childhood reaction. If all of the above answers are "NO", then may proceed with Cephalosporin use.   . Latex Other (See Comments)    Sores  . Sulfa Antibiotics Other (See Comments)    Mouth Sores    No current facility-administered medications on file prior to encounter.    Current Outpatient Medications on File Prior to Encounter  Medication Sig  . cetirizine (ZYRTEC) 10 MG tablet Take 10 mg by mouth at bedtime.  . Prenatal Vit-Fe Fumarate-FA (PRENATAL MULTIVITAMIN) TABS tablet Take 1 tablet by mouth daily.  . valACYclovir (VALTREX) 500 MG tablet Take 500 mg by mouth daily.    FAMILY HISTORY:  Her family history includes Congestive Heart Failure in her father; Diabetes in her father and mother; Hypertension in her father; Stroke in her father.  SOCIAL HISTORY: She  reports that she has never smoked. She has never used smokeless tobacco. She reports that she drank alcohol. She reports  that she does not use drugs.  REVIEW OF SYSTEMS:   Events  SUBJECTIVE:  42 year old female postarrest intubated transported to [facility]  VITAL SIGNS: BP (!) 73/46   Pulse 83   Temp (!) 97.5 F (36.4 C) (Oral)   Resp 15    Ht 5\' 3"  (1.6 m)   Wt 262 lb 12 oz (119.2 kg)   LMP 03/29/2017   SpO2 100%   Breastfeeding? Unknown   BMI 46.54 kg/m   HEMODYNAMICS:    VENTILATOR SETTINGS:    INTAKE / OUTPUT: I/O last 3 completed shifts: In: 1965 [P.O.:840; I.V.:1125] Out: 2824 [Urine:2275; Blood:549]  PHYSICAL EXAMINATION: General: Female sedated on vent who only reaction is to jerk when stimulated Neuro: Currently on full mechanical vent support, does not follow commands at this time.  Drowsy noxious stimuli HEENT: 6.5 endotracheal tube in place, nasogastric tube in place. Cardiovascular: Heart sounds are regular regular rate and rhythm Lungs: Coarse rhonchi decreased at bases Abdomen: Abdominal laxity secondary to status post C-section, lower abdomen incision well approximated without bleeding no vaginal bleeding at this time Musculoskeletal: Intact Skin: Multiple areas of skin art are noted  LABS:  BMET Recent Labs  Lab 12/22/17 1659 2018-01-08 0600  NA 137 135  K 4.3 3.2*  CL 107 101  CO2 21* 13*  BUN 8 8  CREATININE 0.60 0.86  GLUCOSE 93 286*    Electrolytes Recent Labs  Lab 12/22/17 1659 08-Jan-2018 0600  CALCIUM 9.0 8.3*  MG  --  4.1*    CBC Recent Labs  Lab 12/22/17 1659 01-08-18 0600  WBC 9.2 13.7*  HGB 10.0* 13.1  HCT 29.6* 41.1  PLT 226 299    Coag's No results for input(s): APTT, INR in the last 168 hours.  Sepsis Markers No results for input(s): LATICACIDVEN, PROCALCITON, O2SATVEN in the last 168 hours.  ABG Recent Labs  Lab 08-Jan-2018 0550 01/08/2018 0650 01/08/2018 0829  PHART 6.969* 7.245* 7.328*  PCO2ART 61.9* 44.7 44.1  PO2ART 133* 139* 54.0*    Liver Enzymes Recent Labs  Lab 12/22/17 1659 Jan 08, 2018 0600  AST 24 75*  ALT 14 50*  ALKPHOS 155* 170*  BILITOT 0.1* 0.4  ALBUMIN 2.6* 2.4*    Cardiac Enzymes Recent Labs  Lab Jan 08, 2018 0600  TROPONINI 0.03*    Glucose No results for input(s): GLUCAP in the last 168 hours.  Imaging Dg Chest Port 1  View  Result Date: 01-08-2018 CLINICAL DATA:  Cardiac arrest. EXAM: PORTABLE CHEST 1 VIEW COMPARISON:  No prior. FINDINGS: Endotracheal tube noted with tip 4 cm above the carina. Heart size normal. Diffuse bilateral pulmonary infiltrates/edema. No pleural effusion or pneumothorax. IMPRESSION: 1.  Endotracheal tube noted with tip 4 cm above the carina. 2.  Diffuse bilateral pulmonary infiltrates/edema. Electronically Signed   By: Maisie Fus  Register   On: 2018-01-08 07:47     STUDIES:  01-08-18 twelve-lead>> 2018-01-08  2D echo>> 01-08-2018 CT of head>> 01/08/18 EEG>> 18 2019 MRI>>  CULTURES: `  ANTIBIOTICS:   SIGNIFICANT EVENTS: 08-Jan-2018 status post cardiac arrest following C-section for for preeclampsia  LINES/TUBES: 01-08-18 endotracheal tube 6.5 (>> 2018/01/08 left radial arterial line by anesthesia>>  DISCUSSION: 42 year old G3 P1-0-1-1 38 weeks 2 days who presented on 12/22/2017 at 5:06 PM with hypertension.  She underwent repeat C-section at that time.  Did have good fetal movement and no bleeding.  She is admitted at that time. 2018/01/08 0500 hrs. she complained of difficulty breathing.  Nurse returned to room and found her blue  lips.  She has stopped breathing for to be without pulse and was shocked x1 for an unknown rhythm.  She did receive CPR epinephrine and atropine.  He became essentially hypertensive for.  And hypotensive requiring Neo-Synephrine drip.  She had elevated troponins.  She was packaged and transported by the mobile critical care service to [facility] medical intensive care unit on 3 AM on 4. On arrival she was noted to be asynchronous with the vent, did not follow commands.  She was on a propofol drip and Neo-Synephrine drip.  She had a left radial arterial line in place and 2 peripheral IVs in place. Pulmonary critical care was asked to admit.  She was admitted to the intensive care unit consult was placed to cardiology along with a stat 2D echo, arterial blood  gases, chest x-ray which showed good placement of endotracheal tube perihilar edema.  Multiple changes were made to mechanical ventilatory support to increase oxygenation.  There was a concern for pulmonary embolism therefore CT Angio of the chest will be ordered. The concerns here are; questionable PE, questionable press, questionable respiratory arrest secondary for exacerbation of asthma, questionable aspiration, questionable interventricular process leading to cardiac arrest.. Her past medical history as been documented. Portion of examination noted to have myoclonic jerks very concerning.   ASSESSMENT / PLAN:  PULMONARY A: Post 12/23/2017 arrest , PEA History of asthma Vent dependent respiratory failure with intubation secondary to cardiac arrest 12/23/2017 History of preeclampsia treated with labetalol which may have exaggerated her asthma  P:   Vent bundle see orders for details Bronchodilators no steroids at this time CTA of chest to rule out PE Note she has a 6.5 endotracheal tube this may need to be changed in the future Daily chest x-ray swallow ventilator Suspected aspiration we will not start antibiotics at this time.   CARDIOVASCULAR A:  Post arrest presumed PEA Preeclampsia P:  Serial troponins Stat twelve-lead EKG Stat ultrasound echo Cardiology consult CT chest ro  PE   RENAL Lab Results  Component Value Date   CREATININE 0.86 12/23/2017   CREATININE 0.60 12/22/2017   Recent Labs  Lab 12/22/17 1659 12/23/17 0600  K 4.3 3.2*    A:   Hypokalemia P:   Replete electrolytes as needed Follow creatinine  GASTROINTESTINAL A:   History of gastroesophageal reflux disease P:   N.p.o. NG tube low intermittent suction Proton pump inhibitor  HEMATOLOGIC Recent Labs    12/22/17 1659 12/23/17 0600  HGB 10.0* 13.1    A:   No acute issues P:  Follow hemoglobin hematocrit post operative procedure C-section  INFECTIOUS A:   No overt symptoms of  infection P:   Monitor temperature curve white count Note she has a incision lower abdomen post C-section  ENDOCRINE CBG (last 3)  No results for input(s): GLUCAP in the last 72 hours.   A:   No acute issues P:   Follow glucose Sliding scale insulin protocol  NEUROLOGIC A:   12/23/2017 status post PEA arrest Currently sedated with propofol Myoclonic jerks noted towards the end of examination. P:   RASS goal: -1 Stat head CT to rule out intraventricular hemorrhage  EEG MRI of the head once CT is completed to rule out press Sedation as needed for vent tolerance May need neurological consult near future   FAMILY  - Updates: 12/23/2017 0 830 no family at bedside.  - Inter-disciplinary family meet or Palliative Care meeting due by:  day 7  CCT 45  min  Brett CanalesSteve Jalysa Swopes ACNP [facility]ackLe [facility] PCCM Pager [phone] till 1 pm If no answer page [phone] 12/23/2017, 8:37 AM

## 2017-12-23 NOTE — Consult Note (Signed)
LC saw patient (per MD order). Pumping was initiated w/a DEBP and followed by hand expression. Size 27 flanges are appropriate for her at this time. Very little colostrum was yielded. Hand expression was also done with a few drops of colostrum seen. The little amount of colostrum collected was put into a colostrum vial and will be placed in a cooler until determination of compatibility of meds is confirmed.   Per Maisie Fushomas Hale's "Medications & Mother's Milk" (2019), here are the following safety categories for her meds:  -albuterol (L1) -epinephrine (L2) -famotidine (L1) -fentanyl (L2) -levetiracetam (L2) -maxipime (L2) -metronidazole (L2) -midazolam (L2) -phenylephrine (L3) -propofol (L2) -vancomycin (L1)  Although propofol is likely compatible with breastfeeding, there is a question as to the continuous propofol infusion mother is receiving. [facility] (open Mon-Fri, 9a-6p) can be contacted at [phone] if there are questions about medication compatibility w/lactation from  pharmacy/health care team.   Husband confirmed that patient's intention is to breastfeed. Patient's R areola has 2 small linear marks, likely from previous latching attempts.   LC recommendation:  Use a DEBP w/size 27 flanges q 3hrs for 15 minutes. Lubricate flanges with coconut oil prior to pumping. Follow with a couple minutes of hand expression. Wash breast pump parts in hot, soapy water after every use. If there are any breast concerns, please call: [phone] & leave a message with lactation.  Glenetta HewKim Jonuel Butterfield, RN, IBCLC

## 2017-12-23 NOTE — Progress Notes (Signed)
  Echocardiogram 2D Echocardiogram has been performed.  Laura ChimesWendy  [place] Valentine 12/23/2017, 10:59 AM

## 2017-12-23 NOTE — Consult Note (Signed)
Cardiology Consultation:   Patient ID: Laura Valentine; [id number]; 10/23/1975   Admit date: 12/22/2017 Date of Consult: 12/23/2017  Primary Care Provider: [facility], [facility] Primary Cardiologist: Jodelle Red, MD new  Primary Electrophysiologist:     Patient Profile:   Laura Valentine is a 42 y.o. female with a hx of GERD and endometriosis who is being seen today for the evaluation of post-cardiac arrest following cesarean section at the request of Dr. Henderson Cloud.  History of Present Illness:   Ms. Clavel presented to her OB's office for a prenatal check and was found to have an elevated BP. She was sent to women's to monitor her BP and was found to have fluctuating, but elevated BP. She was scheduled for a cesarean section at term. Decision was made to proceed with delivery given her preeclampsia picture. Cesarean section and anesthesia were uncomplicated.  Later that morning, the patient called out to nursing that she was having trouble breathing. On arrival, nurse found her lips blue and she subsequently became unconscious. CPR was initiated with return of circulation after one shock. She was intubated and transported to [facility].   Cardiology was consulted. Echo with normal EF and no valvular abnormalities.     Past Medical History:  Diagnosis Date  . Endometriosis   . GERD (gastroesophageal reflux disease)   . HSV (herpes simplex virus) anogenital infection   . Infection    UTI    Past Surgical History:  Procedure Laterality Date  . BREAST SURGERY Left    lumpectomy, benign  . CESAREAN SECTION    . CESAREAN SECTION WITH BILATERAL TUBAL LIGATION N/A 12/22/2017   Procedure: REPEAT CESAREAN SECTION WITH BILATERAL TUBAL LIGATION;  Surgeon: Carrington Clamp, MD;  Location: [facility] BIRTHING SUITES;  Service: Obstetrics;  Laterality: N/A;  . TONSILLECTOMY       Home Medications:  Prior to Admission medications   Medication Sig Start Date End Date  Taking? Authorizing Provider  cetirizine (ZYRTEC) 10 MG tablet Take 10 mg by mouth at bedtime.   Yes [provider]  omeprazole (PRILOSEC) 40 MG capsule Take 40 mg by mouth every evening.    Yes [provider]  Prenatal Vit-Fe Fumarate-FA (PRENATAL MULTIVITAMIN) TABS tablet Take 1 tablet by mouth daily.   Yes [provider]  valACYclovir (VALTREX) 500 MG tablet Take 500 mg by mouth daily.   Yes [provider]    Inpatient Medications: Scheduled Meds: . albuterol  2.5 mg Nebulization Q6H  . fentaNYL (SUBLIMAZE) injection  100 mcg Intravenous Once  . heparin  5,000 Units Subcutaneous Q8H  . loratadine  10 mg Oral Daily  . measles, mumps and rubella vaccine  0.5 mL Subcutaneous Once  . prenatal multivitamin  1 tablet Oral Q1200  . senna-docusate  2 tablet Oral Q24H  . Tdap  0.5 mL Intramuscular Once   Continuous Infusions: . famotidine (PEPCID) IV    . fentaNYL infusion INTRAVENOUS 250 mcg/hr (12/23/17 1200)  . oxytocin Stopped (12/23/17 1022)  . phenylephrine (NEO-SYNEPHRINE) Adult infusion Stopped (12/23/17 1016)  . propofol (DIPRIVAN) infusion 25 mcg/kg/min (12/23/17 1200)   PRN Meds: acetaminophen, bisacodyl, coconut oil, witch hazel-glycerin **AND** dibucaine, diphenhydrAMINE **OR** diphenhydrAMINE, diphenhydrAMINE, EPINEPHrine, fentaNYL, menthol-cetylpyridinium, methylergonovine **OR** methylergonovine, midazolam, naloxone **AND** sodium chloride flush, ondansetron (ZOFRAN) IV, sodium phosphate, zolpidem  Allergies:    Allergies  Allergen Reactions  . Penicillins Anaphylaxis    Has patient had a PCN reaction causing immediate rash, facial/tongue/throat swelling, SOB or lightheadedness with hypotension: Yes Has patient had a  PCN reaction causing severe rash involving mucus membranes or skin necrosis: No Has patient had a PCN reaction that required hospitalization: No Has patient had a PCN reaction occurring within the last 10 years:  No Childhood reaction. If all of the above answers are "NO", then may proceed with Cephalosporin use.   . Latex Other (See Comments)    Sores  . Sulfa Antibiotics Other (See Comments)    Mouth Sores    Social History:   Social History   Socioeconomic History  . Marital status: Unknown    Spouse name: Not on file  . Number of children: Not on file  . Years of education: Not on file  . Highest education level: Not on file  Occupational History  . Not on file  Social Needs  . Financial resource strain: Not on file  . Food insecurity:    Worry: Not on file    Inability: Not on file  . Transportation needs:    Medical: Not on file    Non-medical: Not on file  Tobacco Use  . Smoking status: Never Smoker  . Smokeless tobacco: Never Used  Substance and Sexual Activity  . Alcohol use: Not Currently    Alcohol/week: 0.0 oz    Comment: once every blue moon  . Drug use: No  . Sexual activity: Yes  Lifestyle  . Physical activity:    Days per week: Not on file    Minutes per session: Not on file  . Stress: Not on file  Relationships  . Social connections:    Talks on phone: Not on file    Gets together: Not on file    Attends religious service: Not on file    Active member of club or organization: Not on file    Attends meetings of clubs or organizations: Not on file    Relationship status: Not on file  . Intimate partner violence:    Fear of current or ex partner: Not on file    Emotionally abused: Not on file    Physically abused: Not on file    Forced sexual activity: Not on file  Other Topics Concern  . Not on file  Social History Narrative  . Not on file    Family History:    Family History  Problem Relation Age of Onset  . Diabetes Mother   . Diabetes Father   . Hypertension Father   . Stroke Father   . Congestive Heart Failure Father      ROS:  Please see the history of present illness.   All other ROS reviewed and negative.     Physical  Exam/Data:   Vitals:   12/23/17 1000 12/23/17 1100 12/23/17 1131 12/23/17 1200  BP: 122/82 95/66  104/73  Pulse:  79  77  Resp: 20 14  14   Temp:   98.8 F (37.1 C)   TempSrc:   Oral   SpO2:  95%  97%  Weight:      Height:        Intake/Output Summary (Last 24 hours) at 12/23/2017 1307 Last data filed at 12/23/2017 1201 Gross per 24 hour  Intake 2413.35 ml  Output 4034 ml  Net -1620.65 ml   Filed Weights   12/22/17 1635 12/22/17 1826 12/23/17 0900  Weight: 262 lb 12 oz (119.2 kg) 262 lb 12 oz (119.2 kg) 259 lb 11.2 oz (117.8 kg)   Body mass index is 46 kg/m.  General:  Well nourished, well developed, in  no acute distress HEENT: normal Neck: no JVD Vascular: No carotid bruits Cardiac:  normal S1, S2; RRR; no murmur  Lungs:  clear to auscultation bilaterally, no wheezing, rhonchi or rales  Abd: soft, nontender, no hepatomegaly  Ext: no edema Musculoskeletal:  No deformities, BUE and BLE strength normal and equal Skin: warm and dry  Neuro:  CNs 2-12 intact, no focal abnormalities noted Psych:  Normal affect   EKG:  The EKG was personally reviewed and demonstrates:  SVT,  Now NSR Telemetry:  Telemetry was personally reviewed and demonstrates:  Now normal sinus  Relevant CV Studies:  Echo 12/23/17: Study Conclusions - Left ventricle: The cavity size was normal. There was mild focal   basal hypertrophy of the septum. Systolic function was normal.   The estimated ejection fraction was in the range of 55% to 60%.   Images were inadequate for LV wall motion assessment. There was   an increased relative contribution of atrial contraction to   ventricular filling. Doppler parameters are consistent with   abnormal left ventricular relaxation (grade 1 diastolic   dysfunction). - Aortic valve: Valve area (VTI): 2.65 cm^2. Valve area (Vmax):   2.49 cm^2. Valve area (Vmean): 2.59 cm^2. - Mitral valve: Moderate diffuse thickening of the anterior leaflet   and posterior leaflet. -  Right ventricle: The cavity size was mildly dilated. Wall   thickness was normal. - Tricuspid valve: There was trivial regurgitation. - Pulmonic valve: There was trivial regurgitation.  Laboratory Data:  Chemistry Recent Labs  Lab 12/22/17 1659 12/23/17 0600 12/23/17 0903  NA 137 135 136  K 4.3 3.2* 5.8*  CL 107 101 106  CO2 21* 13* 22  GLUCOSE 93 286* 134*  BUN 8 8 8   CREATININE 0.60 0.86 0.89  CALCIUM 9.0 8.3* 7.6*  GFRNONAA >60 >60 >60  GFRAA >60 >60 >60  ANIONGAP 9 21* 8    Recent Labs  Lab 12/22/17 1659 12/23/17 0600 12/23/17 0903  PROT 5.7* 6.2* 5.0*  ALBUMIN 2.6* 2.4* 2.1*  AST 24 75* 77*  ALT 14 50* 55*  ALKPHOS 155* 170* 145*  BILITOT 0.1* 0.4 0.5   Hematology Recent Labs  Lab 12/22/17 1659 12/23/17 0600 12/23/17 0903  WBC 9.2 13.7* 20.2*  RBC 3.78* 5.06 4.48  HGB 10.0* 13.1 11.2*  HCT 29.6* 41.1 35.5*  MCV 78.3 81.2 79.2  MCH 26.5 25.9* 25.0*  MCHC 33.8 31.9 31.5  RDW 14.9 15.2 14.6  PLT 226 299 260   Cardiac Enzymes Recent Labs  Lab 12/23/17 0600 12/23/17 0903  TROPONINI 0.03* 0.08*   No results for input(s): TROPIPOC in the last 168 hours.  BNP Recent Labs  Lab 12/23/17 0551  BNP 182.1*    DDimer No results for input(s): DDIMER in the last 168 hours.  Radiology/Studies:  Ct Head Wo Contrast  Result Date: 12/23/2017 CLINICAL DATA:  C-section 12/22/2017. Cardiac arrest. Preeclampsia. EXAM: CT HEAD WITHOUT CONTRAST TECHNIQUE: Contiguous axial images were obtained from the base of the skull through the vertex without intravenous contrast. COMPARISON:  None. FINDINGS: Brain: No evidence of acute infarction, hemorrhage, hydrocephalus, extra-axial collection or mass lesion/mass effect. Negative for cerebral edema. Vascular: Negative for hyperdense vessel Skull: Negative Sinuses/Orbits: Mucosal edema paranasal sinuses with air-fluid level in the sphenoid sinus. Patient is intubated. Negative orbit. Other: None IMPRESSION: Negative CT head  Paranasal sinus disease.  Patient is intubated. Electronically Signed   By: Marlan Palau M.D.   On: 12/23/2017 10:07   Ct Angio Chest Pe W  Or Wo Contrast  Result Date: 12/23/2017 CLINICAL DATA:  PE suspected.  Chest pain. EXAM: CT ANGIOGRAPHY CHEST CT ABDOMEN AND PELVIS WITH CONTRAST TECHNIQUE: Multidetector CT imaging of the chest was performed using the standard protocol during bolus administration of intravenous contrast. Multiplanar CT image reconstructions and MIPs were obtained to evaluate the vascular anatomy. Multidetector CT imaging of the abdomen and pelvis was performed using the standard protocol during bolus administration of intravenous contrast. CONTRAST:  ISOVUE-370 IOPAMIDOL (ISOVUE-370) INJECTION 76% COMPARISON:  None. FINDINGS: CTA CHEST FINDINGS Cardiovascular: Normal heart size. No pericardial effusion. Satisfactory opacification of the pulmonary arteries but limited by pervasive motion artifact. No detected pulmonary embolism, with sensitivity significantly limited beyond the segmental levels. Limited opacification of the systemic arterial tree without abnormality. Mediastinum/Nodes: No noted adenopathy, hematoma, or pneumomediastinum. Lungs/Pleura: Endotracheal tube in good position. The central airways are clear when allowing for motion. There is patchy airspace opacity throughout all lobes, somewhat peribronchovascular apically. Dense consolidation in the medial lower lobes with some volume loss. No effusion, Kerley lines, or pneumothorax. Musculoskeletal: No detected fracture, limited by motion. Review of the MIP images confirms the above findings. CT ABDOMEN and PELVIS FINDINGS Hepatobiliary: Subcapsular lipoma along the right liver.No evidence of biliary obstruction or stone. Pancreas: Unremarkable. Spleen: Unremarkable. Adrenals/Urinary Tract: Negative adrenals. Punctate left lower pole calculus. Presumed small cyst in the left interpolar kidney. Decompressed bladder by a  Foley catheter. Stomach/Bowel: Negative. No evidence of ischemia or obstruction. No inflammatory changes Vascular/Lymphatic: No acute vascular abnormality. No mass or adenopathy. Reproductive:Recently gravid uterus with expected changes of cesarean section. Tubal ligation clips. Other: No ascites or pneumoperitoneum. Musculoskeletal: Subcutaneous lipoma in the left paramedian lumbar back. No acute finding Review of the MIP images confirms the above findings. IMPRESSION: 1. Extensive bilateral airspace disease, history favoring aspiration or noncardiogenic edema over pneumonia. 2. No evidence of pulmonary embolism. Motion is a significantly limiting factor beyond the segmental levels. 3. Expected findings of recent cesarean section. No acute finding in the abdomen. 4. History of recent CPR. No visible fracture as permitted by motion. Negative for air leak. Electronically Signed   By: Marnee Spring M.D.   On: 12/23/2017 10:17   Ct Abdomen Pelvis W Contrast  Result Date: 12/23/2017 CLINICAL DATA:  PE suspected.  Chest pain. EXAM: CT ANGIOGRAPHY CHEST CT ABDOMEN AND PELVIS WITH CONTRAST TECHNIQUE: Multidetector CT imaging of the chest was performed using the standard protocol during bolus administration of intravenous contrast. Multiplanar CT image reconstructions and MIPs were obtained to evaluate the vascular anatomy. Multidetector CT imaging of the abdomen and pelvis was performed using the standard protocol during bolus administration of intravenous contrast. CONTRAST:  ISOVUE-370 IOPAMIDOL (ISOVUE-370) INJECTION 76% COMPARISON:  None. FINDINGS: CTA CHEST FINDINGS Cardiovascular: Normal heart size. No pericardial effusion. Satisfactory opacification of the pulmonary arteries but limited by pervasive motion artifact. No detected pulmonary embolism, with sensitivity significantly limited beyond the segmental levels. Limited opacification of the systemic arterial tree without abnormality. Mediastinum/Nodes:  No noted adenopathy, hematoma, or pneumomediastinum. Lungs/Pleura: Endotracheal tube in good position. The central airways are clear when allowing for motion. There is patchy airspace opacity throughout all lobes, somewhat peribronchovascular apically. Dense consolidation in the medial lower lobes with some volume loss. No effusion, Kerley lines, or pneumothorax. Musculoskeletal: No detected fracture, limited by motion. Review of the MIP images confirms the above findings. CT ABDOMEN and PELVIS FINDINGS Hepatobiliary: Subcapsular lipoma along the right liver.No evidence of biliary obstruction or stone. Pancreas: Unremarkable. Spleen: Unremarkable.  Adrenals/Urinary Tract: Negative adrenals. Punctate left lower pole calculus. Presumed small cyst in the left interpolar kidney. Decompressed bladder by a Foley catheter. Stomach/Bowel: Negative. No evidence of ischemia or obstruction. No inflammatory changes Vascular/Lymphatic: No acute vascular abnormality. No mass or adenopathy. Reproductive:Recently gravid uterus with expected changes of cesarean section. Tubal ligation clips. Other: No ascites or pneumoperitoneum. Musculoskeletal: Subcutaneous lipoma in the left paramedian lumbar back. No acute finding Review of the MIP images confirms the above findings. IMPRESSION: 1. Extensive bilateral airspace disease, history favoring aspiration or noncardiogenic edema over pneumonia. 2. No evidence of pulmonary embolism. Motion is a significantly limiting factor beyond the segmental levels. 3. Expected findings of recent cesarean section. No acute finding in the abdomen. 4. History of recent CPR. No visible fracture as permitted by motion. Negative for air leak. Electronically Signed   By: Marnee Spring M.D.   On: 12/23/2017 10:17   Dg Chest Port 1 View  Result Date: 12/23/2017 CLINICAL DATA:  Status post intubation EXAM: PORTABLE CHEST 1 VIEW COMPARISON:  12/23/2017 FINDINGS: Cardiac shadow is stable. Endotracheal tube  and nasogastric catheter are noted in satisfactory position. Diffuse bilateral infiltrates are again identified and stable given some technical variations in the imaging. No new focal abnormality is seen. IMPRESSION: Stable bilateral infiltrates. Tubes and lines as described. Electronically Signed   By: Alcide Clever M.D.   On: 12/23/2017 08:42   Dg Chest Port 1 View  Result Date: 12/23/2017 CLINICAL DATA:  Cardiac arrest. EXAM: PORTABLE CHEST 1 VIEW COMPARISON:  No prior. FINDINGS: Endotracheal tube noted with tip 4 cm above the carina. Heart size normal. Diffuse bilateral pulmonary infiltrates/edema. No pleural effusion or pneumothorax. IMPRESSION: 1.  Endotracheal tube noted with tip 4 cm above the carina. 2.  Diffuse bilateral pulmonary infiltrates/edema. Electronically Signed   By: Maisie Fus  Register   On: 12/23/2017 07:47    Assessment and Plan:   1. Post-cardiac arrest in the setting of preeclampsia and recent delivery - echo was normal - pt continues to be intubated - CTA negative for PE - head CT negative for acute abnormalities - troponin mildly elevated, likely demand ischemia post-arrest - EKG at 0551 with post-arrest ST changes (code called at 0541), now without acute ischemia - no evidence so far for a cardiac etiology for her arrest - will continue to follow   2. Hyperkalemia - will defer to primary    For questions or updates, please contact [facility] Please consult [email or web address] for contact info under Cardiology/STEMI.   Signed, Roe Rutherford Duke, PA  12/23/2017 1:07 PM

## 2017-12-23 NOTE — Progress Notes (Signed)
Pt on sedation and not responsive.  Neuro is evaluating now.  Pupils have some reactivity.   Vitals:   12/23/17 1000 12/23/17 1100 12/23/17 1131 12/23/17 1200  BP: 122/82 95/66  104/73  Pulse:  79  77  Resp: 20 14  14   Temp:   98.8 F (37.1 C)   TempSrc:   Oral   SpO2:  95%  97%  Weight:      Height:       Head CT - no acute disease  Chest CT - neg for PE  Abd CT -  No acute disease  Echo - normal   EEG - pending  Pt having active seizures now; d/w neuro and Critical Care to restart the magnesium sulfate. Levels are not necessary for treatment except to ensure that levels are not too high.  Will follow with you.

## 2017-12-23 NOTE — Progress Notes (Addendum)
Charge nurse entered room, to pt sitting up in bed, struggling to catch her breath. Called for additional help. Primary nurse entered room to assess. Pt attempting to cough, cyanosis around lips. Pulse was taken and there was no pulse or respirations noted. Charge nurse call [phone]. Rapid response was called and CPR was begun. Arrival of RRT, CRNA's, Anesthesia , OBRR and L&D staff. CPR continued.   For additional information see CPR record

## 2017-12-23 NOTE — Progress Notes (Signed)
EEG complete - results pending 

## 2017-12-23 NOTE — Procedures (Signed)
Central Venous Catheter Insertion Procedure Note Dorene GrebeKimberly Colee [id number] [birth year]  Procedure: Insertion of Central Venous Catheter Indications: Assessment of intravascular volume, Drug and/or fluid administration and Frequent blood sampling  Procedure Details Consent: Risks of procedure as well as the alternatives and risks of each were explained to the (patient/caregiver).  Consent for procedure obtained. Time Out: Verified patient identification, verified procedure, site/side was marked, verified correct patient position, special equipment/implants available, medications/allergies/relevent history reviewed, required imaging and test results available.  Performed  Maximum sterile technique was used including antiseptics, cap, gloves, gown, hand hygiene, mask and sheet. Skin prep: Chlorhexidine; local anesthetic administered A antimicrobial bonded/coated triple lumen catheter was placed in the left internal jugular vein using the Seldinger technique. Ultrasound guidance used.Yes.   Catheter placed to 20 cm. Blood aspirated via all 3 ports and then flushed x 3. Line sutured x 2 and dressing applied.  Evaluation Blood flow good Complications: No apparent complications Patient did tolerate procedure well. Chest X-ray ordered to verify placement.  CXR: pending.  Brett CanalesSteve Minor ACNP [facility] PollackLe [facility] PCCM Pager [phone] till 1 pm If no answer page [phone] 12/23/2017, 12:15 PM

## 2017-12-23 NOTE — Progress Notes (Signed)
Patient transported from MRI and back with no incident placed on settings as documented, ETT patent BBS confirmed vent plugged into red outlet and appropriate oxygen outlets at this time.

## 2017-12-23 NOTE — Consult Note (Addendum)
NEURO HOSPITALIST CONSULT NOTE   Requestig physician: Dr. Henderson CloudHorvath  Reason for Consult: Myoclonus  History obtained from: Chart review and pt's family   HPI:                                                                                                                                          Laura Valentine is a 42 y.o. female with gynecological history G3 P-1-0-1-1, HSV, Endometriosis, Asthma, presented on 12/22/17 after presenting to standard prenatal GYN appointment with onset of elevated blood pressure and later admitted for severe pre-clampsia with SBP's in the 200's prompting need for patient to undergo c- section at 38 weeks. According to providers and notation patient had no complications for delivery up until approximately 5 am this morning when she complained to her nurse to be SOB. The sequence of events resulted in patient later arresting and she was administered ACLS with atropine and epinephrine. She is currently ventilated for airway support. Patient remained hypertensive and later displayed evidence for myoclonus/ jerking like movements prompting neurology consultation.  She was placed on propofol gtt, Fentanyl gtt at 150 mcg and given intermittent doses of Versed which appeared to control the jerking episodes.   Pt became hypotensive with SBP 80's with attempt to wean propofol while assessing at bedside from rate of 20 to 15 mcg/kg/min. Patient began to display further upper body myoclonic jerks that appeared rhythmic. GYN MD, intensivist and Neuro NP's were at bedside. Patient was placed back on magnesium gtt, administered versed, and increased propofol rate to 20.      Past Medical History:  Diagnosis Date  . Endometriosis   . GERD (gastroesophageal reflux disease)   . HSV (herpes simplex virus) anogenital infection   . Infection    UTI    Past Surgical History:  Procedure Laterality Date  . BREAST SURGERY Left    lumpectomy, benign  . CESAREAN  SECTION    . CESAREAN SECTION WITH BILATERAL TUBAL LIGATION N/A 12/22/2017   Procedure: REPEAT CESAREAN SECTION WITH BILATERAL TUBAL LIGATION;  Surgeon: Carrington ClampHorvath, Michelle, MD;  [facility];  Service: Obstetrics;  Laterality: N/A;  . TONSILLECTOMY      Family History  Problem Relation Age of Onset  . Diabetes Mother   . Diabetes Father   . Hypertension Father   . Stroke Father   . Congestive Heart Failure Father               Social History:  reports that she has never smoked. She has never used smokeless tobacco. She reports that she drank alcohol. She reports that she does not use drugs.  Allergies  Allergen Reactions  . Penicillins Anaphylaxis    Has patient had a PCN reaction causing immediate rash, facial/tongue/throat swelling, SOB or  lightheadedness with hypotension: Yes Has patient had a PCN reaction causing severe rash involving mucus membranes or skin necrosis: No Has patient had a PCN reaction that required hospitalization: No Has patient had a PCN reaction occurring within the last 10 years: No Childhood reaction. If all of the above answers are "NO", then may proceed with Cephalosporin use.   . Latex Other (See Comments)    Sores  . Sulfa Antibiotics Other (See Comments)    Mouth Sores    MEDICATIONS:                                                                                                                     Scheduled: . albuterol  2.5 mg Nebulization Q6H  . fentaNYL (SUBLIMAZE) injection  100 mcg Intravenous Once  . heparin injection (subcutaneous)  5,000 Units Subcutaneous Q8H  . loratadine  10 mg Oral Daily  . [START ON 12/24/2017] measles, mumps and rubella vaccine  0.5 mL Subcutaneous Once  . prenatal multivitamin  1 tablet Oral Q1200  . senna-docusate  2 tablet Oral Q24H  . [START ON 12/24/2017] Tdap  0.5 mL Intramuscular Once   Continuous: . sodium chloride 10 mL/hr at 12/23/17 1533  . famotidine (PEPCID) IV    . fentaNYL infusion  INTRAVENOUS 150 mcg/hr (12/23/17 1500)  . [START ON 12/24/2017] levETIRAcetam    . levETIRAcetam 1,500 mg (12/23/17 1535)  . magnesium sulfate 2 g/hr (12/23/17 1458)  . phenylephrine (NEO-SYNEPHRINE) Adult infusion Stopped (12/23/17 1016)  . propofol (DIPRIVAN) infusion 25 mcg/kg/min (12/23/17 1500)   ZOX:WRUEAV chloride, acetaminophen, bisacodyl, coconut oil, [DISCONTINUED] witch hazel-glycerin **AND** dibucaine, diphenhydrAMINE **OR** diphenhydrAMINE, EPINEPHrine, fentaNYL, menthol-cetylpyridinium, methylergonovine **OR** methylergonovine, midazolam, naloxone **AND** sodium chloride flush, ondansetron (ZOFRAN) IV, sodium phosphate   ROS:                                                                                                                                       History obtained from chart review, unobtainable from patient due to mental status and ventilation. Family assited with Hx   Blood pressure 104/73, pulse 77, temperature 98.8 F (37.1 C), temperature source Oral, resp. rate 14, height 5\' 3"  (1.6 m), weight 117.8 kg (259 lb 11.2 oz), last menstrual period 03/29/2017, SpO2 97 %, unknown if currently breastfeeding.   General Examination:  Physical Exam  HEENT-  Normocephalic, no lesions, without obvious abnormality.   Cardiovascular- S1-S2 audible, pulses palpable throughout   Lungs-Patient ventilated  Abdomen- All 4 quadrants soft  Extremities- flaccid, no spontaneous movent other than myoclonus-  Skin-warm and dry, generalized edema t/o, pitting 2+BLE,  PT/ DT pulses present    Neurological Examination Mental Status: Unresponsive PERRL 2 sluggish bilaterally, no corneal reflexed bilaterally, No blink to threat bilaterally Negative occular cephalic/doll's eye  Facial symmetry  Weak gag  Motor/sensory : no movent upper ext's with noxious stimuli, BLE with withdrawal on  noxious stimuli    Occasional myoclonus involving bilateral upper and lower extremities synchronously, with extension of legs and abduction and extension of arms Deep Tendon Reflexes: Hyporeflexic diffusely  Plantars: Right: downgoing   Left: downgoing Cerebellar: Unable to test secondary to current state     Lab Results: Basic Metabolic Panel: Recent Labs  Lab 12/22/17 1659 12/23/17 0600 12/23/17 0903  NA 137 135 136  K 4.3 3.2* 5.8*  CL 107 101 106  CO2 21* 13* 22  GLUCOSE 93 286* 134*  BUN 8 8 8   CREATININE 0.60 0.86 0.89  CALCIUM 9.0 8.3* 7.6*  MG  --  4.1* 2.9*  PHOS  --   --  5.7*    CBC: Recent Labs  Lab 12/22/17 1659 12/23/17 0600 12/23/17 0903  WBC 9.2 13.7* 20.2*  NEUTROABS  --   --  18.2*  HGB 10.0* 13.1 11.2*  HCT 29.6* 41.1 35.5*  MCV 78.3 81.2 79.2  PLT 226 299 260    Cardiac Enzymes: Recent Labs  Lab 12/23/17 0600 12/23/17 0903  TROPONINI 0.03* 0.08*    Lipid Panel: Recent Labs  Lab 12/23/17 0903  TRIG 291*    Imaging: Ct Head Wo Contrast  Result Date: 12/23/2017 CLINICAL DATA:  C-section 12/22/2017. Cardiac arrest. Preeclampsia. EXAM: CT HEAD WITHOUT CONTRAST TECHNIQUE: Contiguous axial images were obtained from the base of the skull through the vertex without intravenous contrast. COMPARISON:  None. FINDINGS: Brain: No evidence of acute infarction, hemorrhage, hydrocephalus, extra-axial collection or mass lesion/mass effect. Negative for cerebral edema. Vascular: Negative for hyperdense vessel Skull: Negative Sinuses/Orbits: Mucosal edema paranasal sinuses with air-fluid level in the sphenoid sinus. Patient is intubated. Negative orbit. Other: None IMPRESSION: Negative CT head Paranasal sinus disease.  Patient is intubated. Electronically Signed   By: Marlan Palau M.D.   On: 12/23/2017 10:07   Ct Angio Chest Pe W Or Wo Contrast  Result Date: 12/23/2017 CLINICAL DATA:  PE suspected.  Chest pain. EXAM: CT ANGIOGRAPHY CHEST CT ABDOMEN AND  PELVIS WITH CONTRAST TECHNIQUE: Multidetector CT imaging of the chest was performed using the standard protocol during bolus administration of intravenous contrast. Multiplanar CT image reconstructions and MIPs were obtained to evaluate the vascular anatomy. Multidetector CT imaging of the abdomen and pelvis was performed using the standard protocol during bolus administration of intravenous contrast. CONTRAST:  ISOVUE-370 IOPAMIDOL (ISOVUE-370) INJECTION 76% COMPARISON:  None. FINDINGS: CTA CHEST FINDINGS Cardiovascular: Normal heart size. No pericardial effusion. Satisfactory opacification of the pulmonary arteries but limited by pervasive motion artifact. No detected pulmonary embolism, with sensitivity significantly limited beyond the segmental levels. Limited opacification of the systemic arterial tree without abnormality. Mediastinum/Nodes: No noted adenopathy, hematoma, or pneumomediastinum. Lungs/Pleura: Endotracheal tube in good position. The central airways are clear when allowing for motion. There is patchy airspace opacity throughout all lobes, somewhat peribronchovascular apically. Dense consolidation in the medial lower lobes with some volume loss. No effusion, Charyl Dancer  lines, or pneumothorax. Musculoskeletal: No detected fracture, limited by motion. Review of the MIP images confirms the above findings. CT ABDOMEN and PELVIS FINDINGS Hepatobiliary: Subcapsular lipoma along the right liver.No evidence of biliary obstruction or stone. Pancreas: Unremarkable. Spleen: Unremarkable. Adrenals/Urinary Tract: Negative adrenals. Punctate left lower pole calculus. Presumed small cyst in the left interpolar kidney. Decompressed bladder by a Foley catheter. Stomach/Bowel: Negative. No evidence of ischemia or obstruction. No inflammatory changes Vascular/Lymphatic: No acute vascular abnormality. No mass or adenopathy. Reproductive:Recently gravid uterus with expected changes of cesarean section. Tubal ligation  clips. Other: No ascites or pneumoperitoneum. Musculoskeletal: Subcutaneous lipoma in the left paramedian lumbar back. No acute finding Review of the MIP images confirms the above findings. IMPRESSION: 1. Extensive bilateral airspace disease, history favoring aspiration or noncardiogenic edema over pneumonia. 2. No evidence of pulmonary embolism. Motion is a significantly limiting factor beyond the segmental levels. 3. Expected findings of recent cesarean section. No acute finding in the abdomen. 4. History of recent CPR. No visible fracture as permitted by motion. Negative for air leak. Electronically Signed   By: Marnee Spring M.D.   On: 12/23/2017 10:17   Ct Abdomen Pelvis W Contrast  Result Date: 12/23/2017 CLINICAL DATA:  PE suspected.  Chest pain. EXAM: CT ANGIOGRAPHY CHEST CT ABDOMEN AND PELVIS WITH CONTRAST TECHNIQUE: Multidetector CT imaging of the chest was performed using the standard protocol during bolus administration of intravenous contrast. Multiplanar CT image reconstructions and MIPs were obtained to evaluate the vascular anatomy. Multidetector CT imaging of the abdomen and pelvis was performed using the standard protocol during bolus administration of intravenous contrast. CONTRAST:  ISOVUE-370 IOPAMIDOL (ISOVUE-370) INJECTION 76% COMPARISON:  None. FINDINGS: CTA CHEST FINDINGS Cardiovascular: Normal heart size. No pericardial effusion. Satisfactory opacification of the pulmonary arteries but limited by pervasive motion artifact. No detected pulmonary embolism, with sensitivity significantly limited beyond the segmental levels. Limited opacification of the systemic arterial tree without abnormality. Mediastinum/Nodes: No noted adenopathy, hematoma, or pneumomediastinum. Lungs/Pleura: Endotracheal tube in good position. The central airways are clear when allowing for motion. There is patchy airspace opacity throughout all lobes, somewhat peribronchovascular apically. Dense consolidation  in the medial lower lobes with some volume loss. No effusion, Kerley lines, or pneumothorax. Musculoskeletal: No detected fracture, limited by motion. Review of the MIP images confirms the above findings. CT ABDOMEN and PELVIS FINDINGS Hepatobiliary: Subcapsular lipoma along the right liver.No evidence of biliary obstruction or stone. Pancreas: Unremarkable. Spleen: Unremarkable. Adrenals/Urinary Tract: Negative adrenals. Punctate left lower pole calculus. Presumed small cyst in the left interpolar kidney. Decompressed bladder by a Foley catheter. Stomach/Bowel: Negative. No evidence of ischemia or obstruction. No inflammatory changes Vascular/Lymphatic: No acute vascular abnormality. No mass or adenopathy. Reproductive:Recently gravid uterus with expected changes of cesarean section. Tubal ligation clips. Other: No ascites or pneumoperitoneum. Musculoskeletal: Subcutaneous lipoma in the left paramedian lumbar back. No acute finding Review of the MIP images confirms the above findings. IMPRESSION: 1. Extensive bilateral airspace disease, history favoring aspiration or noncardiogenic edema over pneumonia. 2. No evidence of pulmonary embolism. Motion is a significantly limiting factor beyond the segmental levels. 3. Expected findings of recent cesarean section. No acute finding in the abdomen. 4. History of recent CPR. No visible fracture as permitted by motion. Negative for air leak. Electronically Signed   By: Marnee Spring M.D.   On: 12/23/2017 10:17   Dg Chest Port 1 View  Result Date: 12/23/2017 CLINICAL DATA:  Status post intubation EXAM: PORTABLE CHEST 1 VIEW COMPARISON:  12/23/2017  FINDINGS: Cardiac shadow is stable. Endotracheal tube and nasogastric catheter are noted in satisfactory position. Diffuse bilateral infiltrates are again identified and stable given some technical variations in the imaging. No new focal abnormality is seen. IMPRESSION: Stable bilateral infiltrates. Tubes and lines as  described. Electronically Signed   By: Alcide Clever M.D.   On: 12/23/2017 08:42   Dg Chest Port 1 View  Result Date: 12/23/2017 CLINICAL DATA:  Cardiac arrest. EXAM: PORTABLE CHEST 1 VIEW COMPARISON:  No prior. FINDINGS: Endotracheal tube noted with tip 4 cm above the carina. Heart size normal. Diffuse bilateral pulmonary infiltrates/edema. No pleural effusion or pneumothorax. IMPRESSION: 1.  Endotracheal tube noted with tip 4 cm above the carina. 2.  Diffuse bilateral pulmonary infiltrates/edema. Electronically Signed   By: Maisie Fus  Register   On: 12/23/2017 07:47   EEG: This is an abnormal electroencephalogram secondary to a burst suppression rhythm that is dominated by suppression.  There were noted two episodes of myoclonus, both associated with burst activity.  There were multiple other burst episodes without clincal correlate.    History and examination documented by Valentina Lucks, MSN, NP-C [facility], [phone]  Impression: 42 y.o. female with PM & GYN hx G3 P-1-0-1-1, HSV, Endometriosis, Asthma, presented on 12/22/17 after presenting to standard prenatal GYN appointment at 38 weeks with onset of elevated blood pressure and later admitted for severe pre-clampsia with SBP's in the 200's necessitating c-section. Yesterday she coded and since then has been unresponsive with myoclonus 1. Most likely component of the DDx is eclampsia with concomitant PRES 2. Also on the DDx is intracranial venous sinus thrombosis with venous strokes and resulting seizure activity 3. Anoxic brain injury from code is also a possibility 4. EEG shows burst suppression with 2 of the recorded bursts being associated with myoclonus. This suggests anoxic brain injury.  5. CT head was negative.   Recommendations: --MRI brain --MRV head --LTM after MRI brain.  --Continue propofol gtt. Neurohospitalist team to titrate to resolution of myoclonus. Current rate is at 25 mcg/kg/min  --Continue Keppra 1500 mg  IV BID --IV magnesium and BP management as per Ob/Gyn and CCM teams --Continue PRN Versed. May need to be switched to a Versed infusion.    Addendum: MRI completed with images reviewed preliminarily. No stroke of venous occlusion identified. Also with no imaging evidence for PRES. However, there is enlargement of the perioptic CSF spaces suggestive of increased ICP. 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 Also relatively likely would be diffuse anoxic brain injury from the cardiac arrest. Continue BP management, Keppra, PRN Versed and, IV magnesium if indicated from Ob/Gyn standpoint, as well as titration of propofol to resolution of myoclonus. LTM EEG also being ordered STAT and EEG team is being called in.   40 minutes spent in the emergent neurological evaluation and management of this critically ill patient  Electronically signed: Dr. Caryl Pina 12/23/2017, 12:15 PM

## 2017-12-23 NOTE — Progress Notes (Addendum)
Results for orders placed or performed during the hospital encounter of 12/22/17 (from the past 24 hour(s))  Protein / creatinine ratio, urine     Status: Abnormal   Collection Time: 12/22/17  4:27 PM  Result Value Ref Range   Creatinine, Urine 50.00 mg/dL   Total Protein, Urine 17 mg/dL   Protein Creatinine Ratio 0.34 (H) 0.00 - 0.15 mg/mg[Cre]  CBC     Status: Abnormal   Collection Time: 12/22/17  4:59 PM  Result Value Ref Range   WBC 9.2 4.0 - 10.5 K/uL   RBC 3.78 (L) 3.87 - 5.11 MIL/uL   Hemoglobin 10.0 (L) 12.0 - 15.0 g/dL   HCT 16.1 (L) 09.6 - 04.5 %   MCV 78.3 78.0 - 100.0 fL   MCH 26.5 26.0 - 34.0 pg   MCHC 33.8 30.0 - 36.0 g/dL   RDW 40.9 81.1 - 91.4 %   Platelets 226 150 - 400 K/uL  Comprehensive metabolic panel     Status: Abnormal   Collection Time: 12/22/17  4:59 PM  Result Value Ref Range   Sodium 137 135 - 145 mmol/L   Potassium 4.3 3.5 - 5.1 mmol/L   Chloride 107 98 - 111 mmol/L   CO2 21 (L) 22 - 32 mmol/L   Glucose, Bld 93 70 - 99 mg/dL   BUN 8 6 - 20 mg/dL   Creatinine, Ser 7.82 0.44 - 1.00 mg/dL   Calcium 9.0 8.9 - 95.6 mg/dL   Total Protein 5.7 (L) 6.5 - 8.1 g/dL   Albumin 2.6 (L) 3.5 - 5.0 g/dL   AST 24 15 - 41 U/L   ALT 14 0 - 44 U/L   Alkaline Phosphatase 155 (H) 38 - 126 U/L   Total Bilirubin 0.1 (L) 0.3 - 1.2 mg/dL   GFR calc non Af Amer >60 >60 mL/min   GFR calc Af Amer >60 >60 mL/min   Anion gap 9 5 - 15  RPR     Status: None   Collection Time: 12/22/17  4:59 PM  Result Value Ref Range   RPR Ser Ql Non Reactive Non Reactive  Type and screen     Status: None   Collection Time: 12/22/17  4:59 PM  Result Value Ref Range   ABO/RH(D) O POS    Antibody Screen NEG    Sample Expiration      12/25/2017 Performed at [facility], [address] [place], [place] [address]   ABO/Rh     Status: None   Collection Time: 12/22/17  5:01 PM  Result Value Ref Range   ABO/RH(D)      O POS Performed at [facility], [address],  [place], [place] [address]   CBC     Status: Abnormal   Collection Time: 12/23/17  6:00 AM  Result Value Ref Range   WBC 13.7 (H) 4.0 - 10.5 K/uL   RBC 5.06 3.87 - 5.11 MIL/uL   Hemoglobin 13.1 12.0 - 15.0 g/dL   HCT 69.6 29.5 - 28.4 %   MCV 81.2 78.0 - 100.0 fL   MCH 25.9 (L) 26.0 - 34.0 pg   MCHC 31.9 30.0 - 36.0 g/dL   RDW 13.2 44.0 - 10.2 %   Platelets 299 150 - 400 K/uL  Comprehensive metabolic panel     Status: Abnormal (Preliminary result)   Collection Time: 12/23/17  6:00 AM  Result Value Ref Range   Sodium 135 135 - 145 mmol/L   Potassium PENDING 3.5 - 5.1  mmol/L   Chloride 101 98 - 111 mmol/L   CO2 13 (L) 22 - 32 mmol/L   Glucose, Bld 286 (H) 70 - 99 mg/dL   BUN 8 6 - 20 mg/dL   Creatinine, Ser 9.560.86 0.44 - 1.00 mg/dL   Calcium 8.3 (L) 8.9 - 10.3 mg/dL   Total Protein 6.2 (L) 6.5 - 8.1 g/dL   Albumin 2.4 (L) 3.5 - 5.0 g/dL   AST 75 (H) 15 - 41 U/L   ALT 50 (H) 0 - 44 U/L   Alkaline Phosphatase 170 (H) 38 - 126 U/L   Total Bilirubin 0.4 0.3 - 1.2 mg/dL   GFR calc non Af Amer >60 >60 mL/min   GFR calc Af Amer >60 >60 mL/min   Anion gap 21 (H) 5 - 15  Magnesium     Status: Abnormal   Collection Time: 12/23/17  6:00 AM  Result Value Ref Range   Magnesium 4.1 (H) 1.7 - 2.4 mg/dL   Vitals:   21/30/8606/07/12 [id number] 12/23/17 0415 12/23/17 0452 12/23/17 0640  BP: (!) 160/86 135/82 (!) 144/77 (!) 154/119  Pulse: 88 76 88 (!) 110  Resp: 16  15   Temp:      TempSrc:      SpO2:    98%  Weight:      Height:        HTN protocol initiated here.

## 2017-12-23 NOTE — Progress Notes (Signed)
Patient transported to CT and returned to 3M04. No complications. Vital signs stable at this time. Patient tolerated well. RT will continue to monitor.

## 2017-12-23 NOTE — Progress Notes (Deleted)
EEG completed, results pending. 

## 2017-12-23 NOTE — Progress Notes (Signed)
I was Administrator Cord on night shift.  Code Blue came through @0541 .  When I entered room I assumed the role of recorder and was running defibrillator. Chest compression were already being preformed and assessment for ET was placement was in process. Patient had no pulse at time that defibrillator pads were placed. @0545 .   Patient no shock advised. @0546  1st dose of Epi.  ET tube placed at 0547,   Primary physician called [phone]. @ [phone] defibrillator anilized, no shock advised, 2nd dose of epi given 0549.  @0550  patient had HR.  Patient had lots of fluid and throughout bagging of patient suction had to be preformed. See volumes, vitals meds charted.  Care link called at 0650, arrived @ [id number].  [facility] out the door @ 0745 [facility]            2

## 2017-12-23 NOTE — Progress Notes (Signed)
CRITICAL VALUE ALERT  Critical Value: troponin 0.03  Date & Time Notied:  12/23/2017 0745  Provider Notified: Dr Henderson CloudHorvath  Orders Received/Actions taken: see chart

## 2017-12-23 NOTE — Progress Notes (Addendum)
Called at 0530 that code for pt was in progress.  Alerted that pt had called out saying she was having trouble breathing and nurse came in to find her lips blue.  Pt lost conciousness after that.  Nurse states that after she was unable to get pulse, she initiated cpr and called code.  When I arrived, code team had pt stabilized with normal rhythym after shocking once, and intubated.    Vitals:   12/23/17 0255 12/23/17 0351 12/23/17 0415 12/23/17 0452  BP: (!) 150/81 (!) 160/86 135/82 (!) 144/77  Pulse: 75 88 76 88  Resp: 16 16  15   Temp:      TempSrc:      SpO2:      Weight:      Height:       Previous vitals as above.  Pt's labs tonight before surgery were all normal including Cr.  Pt had gotten nebulizer treatments in PACU for her raspy voice.    Pt had an otherwise normal post op course until this am.  CBC now is 13/40- other labs are pending.    Lungs per anesthesia rhonchi all over; fluid coming from tube Cor  Normal rhytym now Abd soft, no rigidity, fundus firm below umbilicus, incision intact and not bleeding Pelvic normal BME with normal lochia and no clots, no masses Ex per NP 3+ without clonus.   [facility], waiting on acception physician.   Anesthesia and myself both talked with husband and explained what happened and situation as it is now.    Diff Dx: 1. Unlikely magnesium overload- pt had normal creatinine and normal reflexes. 2. Unlikely amniotic fluid embolus- pt's blood pressures are elevated, not bottomed out. 3. Pt has fluid in lungs- could be pulmonary edema, CXR P. 4. May need imaging of head to make sure not intracranial event secondary preeclampsia.  5. PE needs to be ruled out.

## 2017-12-23 NOTE — Progress Notes (Signed)
Initial Nutrition Assessment  DOCUMENTATION CODES:   Obesity unspecified  INTERVENTION:   Recommend initiation of Enteral Nutrition within 24-48 hours if unable to extubate  Tube Feeding:  Vital High Protein @ 65 ml/hr Provides 1560 kcals, 137 g of protein and 1310 mL of free water Meets 100% estimated calorie and protein needs   NUTRITION DIAGNOSIS:   Inadequate oral intake related to acute illness as evidenced by NPO status.  GOAL:   Patient will meet greater than or equal to 90% of their needs  MONITOR:   Vent status, Labs, Weight trends  REASON FOR ASSESSMENT:   Ventilator    ASSESSMENT:   42 yo [facility] with severe preeclampsia and underwent C-section at 38 weeks and 2 days. Few hours post-op pt complained of difficulty breathing with subsequent cardiac arrest. [facility]. Pt with acute respiratory failure requiring  vent support. PMH of asthma, endometreiosis.   8/01 Severe preeclampsia, underwent C-section 8/02 Cardiac arrest, [facility], Intubated  ECHO normal CT head negative for acute abnormalities CTA negative for PE CT abdomen negative for acute abnormalities  Pt remains on vent support, off sedation and not responsive. Now with seizure activity. Neurology following  No family present on visit today  Labs: potassium 5.8, phosphorus 5.7, magnesium 2.9 Meds: prenatal MVI  NUTRITION - FOCUSED PHYSICAL EXAM:  Unable to assess  Diet Order:   Diet Order    None      EDUCATION NEEDS:   Not appropriate for education at this time  Skin:  Skin Assessment: Reviewed RN Assessment  Last BM:  no documented BM  Height:   Ht Readings from Last 1 Encounters:  12/22/17 5\' 3"  (1.6 m)    Weight:   Wt Readings from Last 1 Encounters:  12/23/17 259 lb 11.2 oz (117.8 kg)    Ideal Body Weight:  52.3 kg  BMI:  Body mass index is 46 kg/m.  Estimated Nutritional  Needs:   Kcal:  [id number] kcals   Protein:  105-132 g  Fluid:  >/= 1.8 L   Romelle Starcherate Alisia Vanengen MS, RD, LDN, CNSC [phone] Pager  [phone] Weekend/On-Call Pager

## 2017-12-23 NOTE — Progress Notes (Signed)
[facility] code [facility] due to unclear etiology. Pt intubated, sedated and unresponsive at bedside.Currently being treated for aspiration pna and monitored for likely hypertensive encephalopathy secondary to ecclampsia. Vital signs are stable at present time.

## 2017-12-24 ENCOUNTER — Inpatient Hospital Stay (HOSPITAL_COMMUNITY): Payer: Managed Care, Other (non HMO)

## 2017-12-24 DIAGNOSIS — Z9889 Other specified postprocedural states: Secondary | ICD-10-CM

## 2017-12-24 DIAGNOSIS — O159 Eclampsia, unspecified as to time period: Secondary | ICD-10-CM

## 2017-12-24 DIAGNOSIS — G40901 Epilepsy, unspecified, not intractable, with status epilepticus: Secondary | ICD-10-CM

## 2017-12-24 DIAGNOSIS — J80 Acute respiratory distress syndrome: Secondary | ICD-10-CM

## 2017-12-24 DIAGNOSIS — J9600 Acute respiratory failure, unspecified whether with hypoxia or hypercapnia: Secondary | ICD-10-CM

## 2017-12-24 LAB — CBC WITH DIFFERENTIAL/PLATELET
ABS IMMATURE GRANULOCYTES: 0.1 10*3/uL (ref 0.0–0.1)
BASOS ABS: 0.1 10*3/uL (ref 0.0–0.1)
Basophils Relative: 1 %
EOS PCT: 1 %
Eosinophils Absolute: 0.1 10*3/uL (ref 0.0–0.7)
HEMATOCRIT: 25.9 % — AB (ref 36.0–46.0)
HEMOGLOBIN: 7.8 g/dL — AB (ref 12.0–15.0)
Immature Granulocytes: 1 %
LYMPHS PCT: 21 %
Lymphs Abs: 2.2 10*3/uL (ref 0.7–4.0)
MCH: 25.4 pg — AB (ref 26.0–34.0)
MCHC: 30.1 g/dL (ref 30.0–36.0)
MCV: 84.4 fL (ref 78.0–100.0)
MONO ABS: 0.7 10*3/uL (ref 0.1–1.0)
Monocytes Relative: 7 %
NEUTROS ABS: 7.5 10*3/uL (ref 1.7–7.7)
Neutrophils Relative %: 71 %
Platelets: 237 10*3/uL (ref 150–400)
RBC: 3.07 MIL/uL — ABNORMAL LOW (ref 3.87–5.11)
RDW: 15.9 % — ABNORMAL HIGH (ref 11.5–15.5)
WBC: 10.6 10*3/uL — ABNORMAL HIGH (ref 4.0–10.5)

## 2017-12-24 LAB — BASIC METABOLIC PANEL
ANION GAP: 7 (ref 5–15)
Anion gap: 5 (ref 5–15)
BUN: 12 mg/dL (ref 6–20)
BUN: 8 mg/dL (ref 6–20)
CHLORIDE: 105 mmol/L (ref 98–111)
CHLORIDE: 110 mmol/L (ref 98–111)
CO2: 25 mmol/L (ref 22–32)
CO2: 24 mmol/L (ref 22–32)
Calcium: 7.2 mg/dL — ABNORMAL LOW (ref 8.9–10.3)
Calcium: 7.1 mg/dL — ABNORMAL LOW (ref 8.9–10.3)
Creatinine, Ser: 0.71 mg/dL (ref 0.44–1.00)
Creatinine, Ser: 0.69 mg/dL (ref 0.44–1.00)
GFR calc non Af Amer: >60 mL/min (ref >60)
Glucose, Bld: 106 mg/dL — ABNORMAL HIGH (ref 70–99)
Glucose, Bld: 102 mg/dL — ABNORMAL HIGH (ref 70–99)
POTASSIUM: 4.8 mmol/L (ref 3.5–5.1)
POTASSIUM: 4.7 mmol/L (ref 3.5–5.1)
SODIUM: 137 mmol/L (ref 135–145)
SODIUM: 139 mmol/L (ref 135–145)

## 2017-12-24 LAB — MAGNESIUM
Magnesium: 5 mg/dL — ABNORMAL HIGH (ref 1.7–2.4)
Magnesium: 4.6 mg/dL — ABNORMAL HIGH (ref 1.7–2.4)

## 2017-12-24 LAB — CBC
HEMATOCRIT: 26.9 % — AB (ref 36.0–46.0)
Hemoglobin: 8.6 g/dL — ABNORMAL LOW (ref 12.0–15.0)
MCH: 26.3 pg (ref 26.0–34.0)
MCHC: 32 g/dL (ref 30.0–36.0)
MCV: 82.3 fL (ref 78.0–100.0)
Platelets: 224 10*3/uL (ref 150–400)
RBC: 3.27 MIL/uL — AB (ref 3.87–5.11)
RDW: 15.6 % — ABNORMAL HIGH (ref 11.5–15.5)
WBC: 12.5 10*3/uL — ABNORMAL HIGH (ref 4.0–10.5)

## 2017-12-24 LAB — GLUCOSE, CAPILLARY
GLUCOSE-CAPILLARY: 113 mg/dL — AB (ref 70–99)
GLUCOSE-CAPILLARY: 85 mg/dL (ref 70–99)
GLUCOSE-CAPILLARY: 89 mg/dL (ref 70–99)
Glucose-Capillary: 110 mg/dL — ABNORMAL HIGH (ref 70–99)
Glucose-Capillary: 96 mg/dL (ref 70–99)
Glucose-Capillary: 80 mg/dL (ref 70–99)

## 2017-12-24 LAB — LACTATE DEHYDROGENASE: LDH: 306 U/L — ABNORMAL HIGH (ref 98–192)

## 2017-12-24 LAB — PHOSPHORUS
PHOSPHORUS: 4.4 mg/dL (ref 2.5–4.6)
PHOSPHORUS: 4.5 mg/dL (ref 2.5–4.6)
Phosphorus: 4.3 mg/dL (ref 2.5–4.6)

## 2017-12-24 LAB — MRSA PCR SCREENING: MRSA by PCR: NEGATIVE

## 2017-12-24 MED ORDER — SODIUM CHLORIDE 0.9 % IV SOLN
20.0000 mg/h | INTRAVENOUS | Status: DC
  Administered 2017-12-24: 20 mg/h via INTRAVENOUS
  Administered 2017-12-24: 10 mg/h via INTRAVENOUS
  Administered 2017-12-24 – 2017-12-25 (×2): 20 mg/h via INTRAVENOUS
  Filled 2017-12-24: qty 10
  Filled 2017-12-24 (×5): qty 20

## 2017-12-24 MED ORDER — PRO-STAT SUGAR FREE PO LIQD: 30.0000 mL | Freq: Two times a day (BID) | ORAL | Status: DC

## 2017-12-24 MED ORDER — PRO-STAT SUGAR FREE PO LIQD
60.0000 mL | Freq: Two times a day (BID) | ORAL | Status: DC
  Administered 2017-12-25 – 2017-12-26 (×3): 60 mL
  Filled 2017-12-24 (×3): qty 60

## 2017-12-24 MED ORDER — PHENYLEPHRINE HCL-NACL 40-0.9 MG/250ML-% IV SOLN
0.0000 ug/min | INTRAVENOUS | Status: DC
  Administered 2017-12-24: 175 ug/min via INTRAVENOUS
  Administered 2017-12-24: 185 ug/min via INTRAVENOUS
  Administered 2017-12-25: 120 ug/min via INTRAVENOUS
  Administered 2017-12-25: 135 ug/min via INTRAVENOUS
  Administered 2017-12-25: 130 ug/min via INTRAVENOUS
  Administered 2017-12-25: 80 ug/min via INTRAVENOUS
  Administered 2017-12-26: 20 ug/min via INTRAVENOUS
  Administered 2017-12-28: 30 ug/min via INTRAVENOUS
  Filled 2017-12-24 (×10): qty 250

## 2017-12-24 MED ORDER — PRO-STAT SUGAR FREE PO LIQD: 30.0000 mL | Freq: Every day | ORAL | Status: DC

## 2017-12-24 MED ORDER — MIDAZOLAM BOLUS VIA INFUSION
15.0000 mg | INTRAVENOUS | Status: AC
  Administered 2017-12-24 (×3): 15 mg via INTRAVENOUS
  Filled 2017-12-24 (×3): qty 15

## 2017-12-24 MED ORDER — ACETAMINOPHEN 650 MG RE SUPP: 650.0000 mg | RECTAL | Status: DC | PRN

## 2017-12-24 MED ORDER — ACETAMINOPHEN 325 MG PO TABS
650.0000 mg | ORAL_TABLET | ORAL | Status: DC | PRN
  Administered 2017-12-24: 650 mg via ORAL
  Filled 2017-12-24: qty 2

## 2017-12-24 MED ORDER — VITAL HIGH PROTEIN PO LIQD: 1000.0000 mL | ORAL | Status: DC

## 2017-12-24 MED ORDER — ACETAMINOPHEN 160 MG/5ML PO SOLN
650.0000 mg | ORAL | Status: DC | PRN
  Administered 2017-12-27 – 2017-12-31 (×10): 650 mg
  Filled 2017-12-24 (×11): qty 20.3

## 2017-12-24 MED ORDER — SODIUM CHLORIDE 0.9 % IV BOLUS
1000.0000 mL | Freq: Once | INTRAVENOUS | Status: AC
  Administered 2017-12-24: 1000 mL via INTRAVENOUS

## 2017-12-24 MED ORDER — SODIUM CHLORIDE 0.9 % IV SOLN
2.0000 g | Freq: Three times a day (TID) | INTRAVENOUS | Status: DC
  Administered 2017-12-24 – 2017-12-28 (×12): 2 g via INTRAVENOUS
  Filled 2017-12-24 (×13): qty 2

## 2017-12-24 NOTE — Progress Notes (Signed)
EEG with continuos sz v myoclonus. Propofol gradually increased to 70/hr. Adding Versed now. (15mg  q5 min for 3 doses followed by infusion of 10mg /hr)  -- Milon DikesAshish Donzell Coller, MD [facility] Pager: [phone] If 7pm to 7am, please call on call as listed on AMION.

## 2017-12-24 NOTE — Progress Notes (Signed)
Pt breasts pumped X 15 minutes and hand expression done.  A few drops of colostrum collected and placed in cooler.  Pt tolerated well

## 2017-12-24 NOTE — Progress Notes (Signed)
eeg maint complete. Continue to monitor °

## 2017-12-24 NOTE — Procedures (Signed)
Arterial Catheter Insertion Procedure Note Laura GrebeKimberly Valentine [id number] 1975-08-19  Procedure: Insertion of Arterial Catheter  Indications: Blood pressure monitoring and Frequent blood sampling  Procedure Details Consent: Unable to obtain consent because of altered level of consciousness. Time Out: Verified patient identification, verified procedure, site/side was marked, verified correct patient position, special equipment/implants available, medications/allergies/relevent history reviewed, required imaging and test results available.  Performed  Maximum sterile technique was used including antiseptics, cap, gloves, gown, hand hygiene, mask and sheet. Skin prep: Chlorhexidine; local anesthetic administered 20 gauge catheter was inserted into left femoral artery using the Seldinger technique. ULTRASOUND GUIDANCE USED: YES   Evaluation Unsuccessful due to inability to advance wire Start with good blood flow then clots quickly Complications: No apparent complications.   Laura BalsamKristen D Leona Valentine 12/24/2017

## 2017-12-24 NOTE — Procedures (Signed)
Electroencephalography report.  Long-term monitoring  Recording begins 12/23/2017 at 2123 Recording ends 12/24/2017 at 7:20 AM  Day 1  Cpt [id number]  Date acquisition: International 10-20 4 electrode placement.  18 channels EEG with additional EKG channel  This intensive EEG monitoring with simultaneous video monitoring was performed for this patient with myoclonic jerks and status post cardiac arrest to rule out clinical and subclinical electrographic seizures.  Patient is intubated.  Sedation status is unavailable  Background activities marked by attenuated broad delta slowing with superimposed faster frequencies in the beta range.  Near continuous electrode artifact present broadly throughout the recording.  As recording begins patient have frequent myoclonic jerks which subsided.  Through the rest of the recording background activities marked by attenuated delta slowing with occasionally a low amplitude broad sharp waves suggestive of cortical irritability.   Clinical interpretation: This 9 hours of intensive EEG monitoring with simultaneous video monitoring consistent with resolution of post anoxic myoclonus.  Background activities suggestive of severe encephalopathy of nonspecific etiologies however most likely related to cerebral anoxia in this particular case.  Sedation status may contribute to these findings.  Clinical correlation is advised.

## 2017-12-24 NOTE — Progress Notes (Signed)
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   Cardiovascular- S1-S2 audible, pulses palpable throughout   Lungs-Patient ventilated- sound t/o  Extremities- flaccid without purposeful or spontaneous movement  edema t/o, pitting 2+BLE,  PT/ DT pulses present    Neurological Examination Mental Status: Sedated with RASS -5 induced  CN: PERRL 2 mm sluggish bilaterally, no corneal reflexes bilaterally, No blink to threat bilaterally Negative occular cephalic/doll's eye  Facial symmetry  Gag/Cough- not tested to minimize sedation interruption   Motor/Sensory: Sedated with no purposeful movement Deep Tendon Reflexes: Hyporeflexic diffusely  Plantars: Right: downgoing                           Left: downgoing Cerebellar: Unable to test secondary to current state    Medications:  I have reviewed the patient's current medications. Scheduled: . albuterol  2.5 mg Nebulization Q6H  . chlorhexidine gluconate (MEDLINE KIT)  15 mL Mouth Rinse BID  . feeding supplement (PRO-STAT SUGAR FREE 64)  30 mL Per Tube BID  . feeding supplement (VITAL HIGH PROTEIN)  1,000 mL Per Tube Q24H  . fentaNYL (SUBLIMAZE) injection  100 mcg Intravenous Once  . heparin injection (subcutaneous)  5,000 Units Subcutaneous Q8H  . measles, mumps and rubella vaccine  0.5 mL Subcutaneous Once  . mouth rinse  15 mL Mouth Rinse 10 times per day  . prenatal multivitamin  1 tablet Oral Q1200  . senna-docusate  2 tablet Oral Q24H  . Tdap  0.5 mL Intramuscular Once   Continuous: . sodium  chloride 10 mL/hr at 12/24/17 0800  . sodium chloride    . famotidine (PEPCID) IV Stopped (12/23/17 2235)  . fentaNYL infusion INTRAVENOUS 150 mcg/hr (12/24/17 0800)  . levETIRAcetam Stopped (12/24/17 0412)  . magnesium sulfate 2 g/hr (12/24/17 0816)  . midazolam (VERSED) infusion 20 mg/hr (12/24/17 0800)  . phenylephrine (NEO-SYNEPHRINE) Adult infusion 100 mcg/min (12/24/17 0800)  . propofol (DIPRIVAN) infusion 70 mcg/kg/min (12/24/17 0800)  . valproate sodium 500 mg (12/24/17 0802)  . vancomycin Stopped (12/24/17 0204)   YCX:KGYJEH chloride, Place/Maintain arterial line **AND** sodium chloride, acetaminophen **OR** acetaminophen (TYLENOL) oral liquid 160 mg/5 mL **OR** acetaminophen, acetaminophen, bisacodyl, clonazePAM, coconut oil, [DISCONTINUED] witch hazel-glycerin **AND** dibucaine, diphenhydrAMINE **OR** diphenhydrAMINE, fentaNYL, methylergonovine **OR** methylergonovine, midazolam, ondansetron (ZOFRAN) IV, sodium phosphate  Pertinent Labs/Diagnostics:   Ct Head Wo Contrast  Result Date: 12/23/2017 CLINICAL DATA:  C-section 12/22/2017. Cardiac arrest. Preeclampsia. EXAM: CT HEAD WITHOUT CONTRAST TECHNIQUE: Contiguous axial images were obtained from the base of the skull through the vertex without intravenous contrast. COMPARISON:  None. FINDINGS: Brain: No evidence of acute infarction, hemorrhage, hydrocephalus, extra-axial collection or mass lesion/mass effect. Negative for cerebral edema. Vascular: Negative for hyperdense vessel Skull: Negative Sinuses/Orbits: Mucosal edema paranasal sinuses with air-fluid level in the sphenoid sinus. Patient is intubated. Negative orbit. Other: None IMPRESSION: Negative CT head Paranasal sinus disease.  Patient is intubated. Electronically Signed   By: Franchot Gallo M.D.   On: 12/23/2017 10:07   Ct  Angio Chest Pe W Or Wo Contrast  Result Date: 12/23/2017 CLINICAL DATA:  PE suspected.  Chest pain. EXAM: CT ANGIOGRAPHY CHEST CT ABDOMEN AND PELVIS WITH  CONTRAST TECHNIQUE: Multidetector CT imaging of the chest was performed using the standard protocol during bolus administration of intravenous contrast. Multiplanar CT image reconstructions and MIPs were obtained to evaluate the vascular anatomy. Multidetector CT imaging of the abdomen and pelvis was performed using the standard protocol during bolus administration of intravenous contrast. CONTRAST:  161m ISOVUE-370 IOPAMIDOL (ISOVUE-370) INJECTION 76% COMPARISON:  None. FINDINGS: CTA CHEST FINDINGS Cardiovascular: Normal heart size. No pericardial effusion. Satisfactory opacification of the pulmonary arteries but limited by pervasive motion artifact. No detected pulmonary embolism, with sensitivity significantly limited beyond the segmental levels. Limited opacification of the systemic arterial tree without abnormality. Mediastinum/Nodes: No noted adenopathy, hematoma, or pneumomediastinum. Lungs/Pleura: Endotracheal tube in good position. The central airways are clear when allowing for motion. There is patchy airspace opacity throughout all lobes, somewhat peribronchovascular apically. Dense consolidation in the medial lower lobes with some volume loss. No effusion, Kerley lines, or pneumothorax. Musculoskeletal: No detected fracture, limited by motion. Review of the MIP images confirms the above findings. CT ABDOMEN and PELVIS FINDINGS Hepatobiliary: Subcapsular lipoma along the right liver.No evidence of biliary obstruction or stone. Pancreas: Unremarkable. Spleen: Unremarkable. Adrenals/Urinary Tract: Negative adrenals. Punctate left lower pole calculus. Presumed small cyst in the left interpolar kidney. Decompressed bladder by a Foley catheter. Stomach/Bowel: Negative. No evidence of ischemia or obstruction. No inflammatory changes Vascular/Lymphatic: No acute vascular abnormality. No mass or adenopathy. Reproductive:Recently gravid uterus with expected changes of cesarean section. Tubal ligation clips.  Other: No ascites or pneumoperitoneum. Musculoskeletal: Subcutaneous lipoma in the left paramedian lumbar back. No acute finding Review of the MIP images confirms the above findings. IMPRESSION: 1. Extensive bilateral airspace disease, history favoring aspiration or noncardiogenic edema over pneumonia. 2. No evidence of pulmonary embolism. Motion is a significantly limiting factor beyond the segmental levels. 3. Expected findings of recent cesarean section. No acute finding in the abdomen. 4. History of recent CPR. No visible fracture as permitted by motion. Negative for air leak. Electronically Signed   By: JMonte FantasiaM.D.   On: 12/23/2017 10:17   Mr Brain Wo Contrast  Result Date: 12/23/2017 CLINICAL DATA:  Post cardiac arrest after C-section. Preeclampsia. High blood pressures. Possible aspiration. EXAM: MRI HEAD WITHOUT CONTRAST MRV HEAD WITHOUT CONTRAST TECHNIQUE: Multiplanar, multiecho pulse sequences of the brain and surrounding structures were obtained without intravenous contrast. Angiographic images of the intracranial venous structures were obtained using MRV technique without intravenous contrast. COMPARISON:  CT head 12/23/2017. FINDINGS: MRI BRAIN: Brain: No acute stroke, visible hemorrhage, mass lesion, hydrocephalus, or extra-axial fluid. No signs of anoxic injury, or hypoxic/ischemic insult. Normal for age cerebral volume. No white matter disease. No signs of preeclampsia/eclampsia/PRES. Vessels: Flow voids are maintained. No abnormality on gradient sequence. Skull and upper cervical region: Diffuse low signal on T1 weighted images, nonspecific. Sinuses and orbits. No orbital findings. 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 Some layering fluid in the sphenoid sinus, likely related to recumbency. MRV: The superior sagittal sinus, internal cerebral veins, vein of Galen, and straight sinuses are all widely patent. No visible cortical venous  thrombosis. Dominant RIGHT transverse and sigmoid sinus pattern, with both sides patent, and subsequent visualization of the BILATERAL patent and dominant RIGHT IJ. No signs of nonocclusive intrasinus thrombus on routine brain MR or axial source images. IMPRESSION: Noncontrast MRI  of the brain is within normal limits. No evidence for anoxia or hypoxic/ischemic insult. No imaging signs of preeclampsia/eclampsia/PRES. MRV of the intracranial circulation demonstrates no evidence for sinovenous occlusive disease. Electronically Signed   By: Staci Righter M.D.   On: 12/23/2017 19:48   Ct Abdomen Pelvis W Contrast  Result Date: 12/23/2017 CLINICAL DATA:  PE suspected.  Chest pain. EXAM: CT ANGIOGRAPHY CHEST CT ABDOMEN AND PELVIS WITH CONTRAST TECHNIQUE: Multidetector CT imaging of the chest was performed using the standard protocol during bolus administration of intravenous contrast. Multiplanar CT image reconstructions and MIPs were obtained to evaluate the vascular anatomy. Multidetector CT imaging of the abdomen and pelvis was performed using the standard protocol during bolus administration of intravenous contrast. CONTRAST:  178m ISOVUE-370 IOPAMIDOL (ISOVUE-370) INJECTION 76% COMPARISON:  None. FINDINGS: CTA CHEST FINDINGS Cardiovascular: Normal heart size. No pericardial effusion. Satisfactory opacification of the pulmonary arteries but limited by pervasive motion artifact. No detected pulmonary embolism, with sensitivity significantly limited beyond the segmental levels. Limited opacification of the systemic arterial tree without abnormality. Mediastinum/Nodes: No noted adenopathy, hematoma, or pneumomediastinum. Lungs/Pleura: Endotracheal tube in good position. The central airways are clear when allowing for motion. There is patchy airspace opacity throughout all lobes, somewhat peribronchovascular apically. Dense consolidation in the medial lower lobes with some volume loss. No effusion, Kerley lines, or  pneumothorax. Musculoskeletal: No detected fracture, limited by motion. Review of the MIP images confirms the above findings. CT ABDOMEN and PELVIS FINDINGS Hepatobiliary: Subcapsular lipoma along the right liver.No evidence of biliary obstruction or stone. Pancreas: Unremarkable. Spleen: Unremarkable. Adrenals/Urinary Tract: Negative adrenals. Punctate left lower pole calculus. Presumed small cyst in the left interpolar kidney. Decompressed bladder by a Foley catheter. Stomach/Bowel: Negative. No evidence of ischemia or obstruction. No inflammatory changes Vascular/Lymphatic: No acute vascular abnormality. No mass or adenopathy. Reproductive:Recently gravid uterus with expected changes of cesarean section. Tubal ligation clips. Other: No ascites or pneumoperitoneum. Musculoskeletal: Subcutaneous lipoma in the left paramedian lumbar back. No acute finding Review of the MIP images confirms the above findings. IMPRESSION: 1. Extensive bilateral airspace disease, history favoring aspiration or noncardiogenic edema over pneumonia. 2. No evidence of pulmonary embolism. Motion is a significantly limiting factor beyond the segmental levels. 3. Expected findings of recent cesarean section. No acute finding in the abdomen. 4. History of recent CPR. No visible fracture as permitted by motion. Negative for air leak. Electronically Signed   By: JMonte FantasiaM.D.   On: 12/23/2017 10:17   Dg Chest Port 1 View  Result Date: 12/24/2017 CLINICAL DATA:  Respiratory failure EXAM: PORTABLE CHEST 1 VIEW COMPARISON:  12/23/2017 FINDINGS: Cardiac shadow is stable. Endotracheal tube, nasogastric catheter and left jugular catheter are again seen. Bilateral infiltrates are again noted although slightly improved when compared with the prior study. No sizable effusion is seen. No bony abnormality is noted. IMPRESSION: Slight improvement in bilateral infiltrates when compared with the prior exam. Electronically Signed   By: MInez Catalina M.D.   On: 12/24/2017 07:56   Dg Chest Port 1 View  Result Date: 12/23/2017 CLINICAL DATA:  Status post intubation EXAM: PORTABLE CHEST 1 VIEW COMPARISON:  12/23/2017 FINDINGS: Cardiac shadow is stable. Endotracheal tube and nasogastric catheter are noted in satisfactory position. Diffuse bilateral infiltrates are again identified and stable given some technical variations in the imaging. No new focal abnormality is seen. IMPRESSION: Stable bilateral infiltrates. Tubes and lines as described. Electronically Signed   By: MInez CatalinaM.D.   On: 12/23/2017 08:42  Dg Chest Port 1 View  Result Date: 12/23/2017 CLINICAL DATA:  Cardiac arrest. EXAM: PORTABLE CHEST 1 VIEW COMPARISON:  No prior. FINDINGS: Endotracheal tube noted with tip 4 cm above the carina. Heart size normal. Diffuse bilateral pulmonary infiltrates/edema. No pleural effusion or pneumothorax. IMPRESSION: 1.  Endotracheal tube noted with tip 4 cm above the carina. 2.  Diffuse bilateral pulmonary infiltrates/edema. Electronically Signed   By: Marcello Moores  Register   On: 12/23/2017 07:47   Dg Chest Port 1v Same Day  Result Date: 12/23/2017 CLINICAL DATA:  Check central line placement EXAM: PORTABLE CHEST 1 VIEW COMPARISON:  Film from earlier in the same day. FINDINGS: Endotracheal tube is again identified and stable. Nasogastric catheter has been removed. Left jugular central line is noted in the proximal superior vena cava. No pneumothorax is seen. Diffuse bilateral infiltrates are identified and stable. No bony abnormality is noted. IMPRESSION: Tubes and lines as described above. Stable bilateral infiltrates. Electronically Signed   By: Inez Catalina M.D.   On: 12/23/2017 13:36   Dg Abd Portable 1v  Result Date: 12/24/2017 CLINICAL DATA:  NG tube placement EXAM: PORTABLE ABDOMEN - 1 VIEW COMPARISON:  12/23/2017 FINDINGS: NG tube tip is positioned in the mid stomach. Basilar airspace disease noted in the lungs bilaterally. Bowel gas pattern is  nonspecific. IMPRESSION: NG tube tip is in the mid stomach. Electronically Signed   By: Misty Stanley M.D.   On: 12/24/2017 01:34   Dg Abd Portable 1v  Result Date: 12/23/2017 CLINICAL DATA:  Check gastric catheter placement EXAM: PORTABLE ABDOMEN - 1 VIEW COMPARISON:  Films from earlier in the same day. FINDINGS: Previously seen nasogastric catheter is no longer identified. Scattered large and small bowel gas is noted. Contrast material is noted within the renal collecting systems consistent with the recent CT. IMPRESSION: No gastric catheter is identified. Clinical correlation is recommended. Electronically Signed   By: Inez Catalina M.D.   On: 12/23/2017 13:37   Mr Mrv Head Wo Cm  Result Date: 12/23/2017 CLINICAL DATA:  Post cardiac arrest after C-section. Preeclampsia. High blood pressures. Possible aspiration. EXAM: MRI HEAD WITHOUT CONTRAST MRV HEAD WITHOUT CONTRAST TECHNIQUE: Multiplanar, multiecho pulse sequences of the brain and surrounding structures were obtained without intravenous contrast. Angiographic images of the intracranial venous structures were obtained using MRV technique without intravenous contrast. COMPARISON:  CT head 12/23/2017. FINDINGS: MRI BRAIN: Brain: No acute stroke, visible hemorrhage, mass lesion, hydrocephalus, or extra-axial fluid. No signs of anoxic injury, or hypoxic/ischemic insult. Normal for age cerebral volume. No white matter disease. No signs of preeclampsia/eclampsia/PRES. Vessels: Flow voids are maintained. No abnormality on gradient sequence. Skull and upper cervical region: Diffuse low signal on T1 weighted images, nonspecific. Sinuses and orbits. No orbital findings. 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 Some layering fluid in the sphenoid sinus, likely related to recumbency. MRV: The superior sagittal sinus, internal cerebral veins, vein of Galen, and straight sinuses are all widely patent. No visible cortical  venous thrombosis. Dominant RIGHT transverse and sigmoid sinus pattern, with both sides patent, and subsequent visualization of the BILATERAL patent and dominant RIGHT IJ. No signs of nonocclusive intrasinus thrombus on routine brain MR or axial source images. IMPRESSION: Noncontrast MRI of the brain is within normal limits. No evidence for anoxia or hypoxic/ischemic insult. No imaging signs of preeclampsia/eclampsia/PRES. MRV of the intracranial circulation demonstrates no evidence for sinovenous occlusive disease. Electronically Signed   By: Staci Righter M.D.   On: 12/23/2017  19:48    Imaging  Routine EEG: 12/23/17 This is an abnormal electroencephalogram secondary to a burst suppression rhythm that is dominated by suppression. There were noted two episodes of myoclonus, both associated with burst activity. There were multiple other burst episodes without clincal correlate.   cEEG day 1 overnight 8/3 This 9 hours of intensive EEG monitoring with simultaneous video monitoring consistent with resolution of post anoxic myoclonus.  Background activities suggestive of severe encephalopathy of nonspecific etiologies however most likely related to cerebral anoxia in this particular case.  Sedation status may contribute to these findings.  12/23/17 MRI brain w/o-MRV Noncontrast MRI of the brain is within normal limits. No evidence for anoxia or hypoxic/ischemic insult. No imaging signs of preeclampsia/eclampsia/PRES. MRV of the intracranial circulation demonstrates no evidence for sinovenous occlusive disease.   Impression: 42 y.o. 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 Patient coded 1 day post of delivery and since then has been unresponsive with myoclonus.   1.  Continuous EEG reveals findings most consistent with cerebral anoxia from recent code.  2. Enlargement of the perioptic CSF spaces suggestive of increased ICP. Most likely secondary to edema from diffuse anoxic brain injury, with injury too early in evolution to be visible on MRI.  3. 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   4. MRI of the brain is within normal limits. No evidence for anoxia or hypoxic/ischemic insult;no imaging evidence for PRES. 5. 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 This has resolved on the EEG.   Recommendations: --Continue propofol gtt. Neurohospitalist team to titrate to resolution of myoclonus. Current rate is at 70 mcg/kg/min,   --Continue Keppra 1000 mg IV BID - Goal to maintain adequate BP and MAP >80 for brain perfusion- discussed with Intensivist- Patient now on neo-synephrine gtt --IV magnesium if indicated Ob/Gyn and CCM teams --Continue PRN Versed. Versed infusion if needed - Supportive of Normothermia protocol  - Continue medical management to prevent lowering of seizure threshhold   Lilyan Gilford, NP-C locum- neurology   Laurey Morale, MSN, NP-C [facility], [phone]  35 minutes of critical care time spent in the evaluation and management of this critically ill patient. Time spent included EEG review and coordination of care.   Electronically signed: Dr. Kerney Elbe 12/24/2017, 8:30 AM

## 2017-12-24 NOTE — Procedures (Signed)
Arterial Catheter Insertion Procedure Note Laura GrebeKimberly Valentine [id number] Apr 24, 1976  Procedure: Insertion of Arterial Catheter  Indications: Blood pressure monitoring and Frequent blood sampling  Procedure Details Consent: Risks of procedure as well as the alternatives and risks of each were explained to the (patient/caregiver).  Consent for procedure obtained. Time Out: Verified patient identification, verified procedure, site/side was marked, verified correct patient position, special equipment/implants available, medications/allergies/relevent history reviewed, required imaging and test results available.  Performed  Maximum sterile technique was used including antiseptics, cap, gloves, gown, hand hygiene, mask and sheet. Skin prep: Chlorhexidine; local anesthetic administered   Attempted R femoral aline without success.  Unable to cannulate artery.  Pressure held at site until hemostasis.  Pressure dressing applied.     Laura BrimBrandi Kilan Banfill, NP-C [facility] Pgr: [phone] or if no answer [phone] 12/24/2017, 2:59 AM

## 2017-12-24 NOTE — Progress Notes (Addendum)
Pt breasts pumped X 15 minutes and hand expression done.  A few drops of colostrum collected and placed in cooler.  Pt tolerated well 

## 2017-12-24 NOTE — Progress Notes (Signed)
Titrated O2 to 40%

## 2017-12-24 NOTE — Progress Notes (Signed)
Pt sedated and intubated. Visited to assess obstetric post op status Incision intact, minimal drainage, healing well Abd: fundus firm, below umbilicus and absent bleeding with fundal massage Ext: SCDs in place Discussed status with ICU nursing and reviewed interdisciplinary notes, management much appreciated. Please contact our service with an ob/gyn concerns.

## 2017-12-24 NOTE — Procedures (Signed)
Arterial Catheter Insertion Procedure Note Laura Valentine [id number] 06-07-1975  Procedure: Insertion of Arterial Catheter  Indications: Blood pressure monitoring  Procedure Details Consent: Risks of procedure as well as the alternatives and risks of each were explained to the (patient/caregiver).  Consent for procedure obtained. Time Out: Verified patient identification, verified procedure, site/side was marked, verified correct patient position, special equipment/implants available, medications/allergies/relevent history reviewed, required imaging and test results available.  Performed  Maximum sterile technique was used including antiseptics, cap, gloves, gown, hand hygiene, mask and sheet. Skin prep: Chlorhexidine; local anesthetic administered 20 gauge catheter was inserted into right radial artery using the Seldinger technique. ULTRASOUND GUIDANCE USED: NO Evaluation Blood flow ; BP tracing Unable to place. Complications: No apparent complications. Unsuccessful due to inability to advance wire    Alvis Lemmingsrnie R Coben Godshall 12/24/2017

## 2017-12-24 NOTE — Progress Notes (Signed)
Nutrition Follow-up  DOCUMENTATION CODES:   Obesity unspecified  INTERVENTION:  - Will order 30 mL Prostat once/day and 60 mL Prostat BID (5 packets/day). This regimen + kcal from current Propofol rate will provide 1807 kcal (110% estimated kcal need), 75 grams of protein (71% estimated protein need). - Continue to monitor Propofol rate/changes and will add TF formula/adjust TF regimen as able.   NUTRITION DIAGNOSIS:   Inadequate oral intake related to acute illness as evidenced by NPO status. -ongoing  GOAL:   Patient will meet greater than or equal to 90% of their needs -unmet at this time.   MONITOR:   Vent status, TF tolerance, Weight trends, Labs, I & O's  REASON FOR ASSESSMENT:   Consult Enteral/tube feeding initiation and management  ASSESSMENT:   42 yo female [facility] 41[facility]19 with severe preeclampsia and underwent C-section at 41 weeks and 2 days. Few hours post-op pt complained of difficulty breathing with subsequent cardiac arrest. [facility]. Pt with acute respiratory failure requiring  vent support. PMH of asthma, endometreiosis.   Weight -8 lbs/3.7 kg compared to yesterday. Estimated nutrition needs remains appropriate. Patient remains intubated, sedated, NGT in place. Mom at bedside and is familiar with TF d/t caring for another family member. She denies questions or concerns at this time. RN at bedside and reports high Propofol rate d/t seizures, no plan for decrease at this time.   Per Dr. Ulyses JarredMcQuaid's note this AM: eclampsia/HTN now resolved, mild liver shock now improving, aspiration PNA, MAP goal: >80.  Patient is currently intubated on ventilator support MV: 8.2 L/min Temp (24hrs), Avg:96.1 F (35.6 C), Min:80 F (26.7 C), Max:98.6 F (37 C) Propofol: 49.5 ml/hr (1307 kcal) BP: 124/61 and MAP: 78  Medications reviewed; 20 mg IV Pepcid/day, 1 tablet prenatal multivitamin/day, 2 tablets  Senokot/day. Labs reviewed; CBGs: 113, 110, and 96 mg/dL today, Ca: 7.2 mg/dL, Mg: 5 mg/dL and has been trending up since admission.    Drips: Neo @ 200 mcg/min, Versed @ 20 mg/hr, Propofol @ 70 mcg/kg/min, Fentanyl @ 150 mcg/hr.    Diet Order:   Diet Order           Diet NPO time specified  Diet effective now          EDUCATION NEEDS:   Not appropriate for education at this time  Skin:  Skin Assessment: Skin Integrity Issues: Skin Integrity Issues:: Incisions Incisions: abdominal and perineum (8/1, C-section)  Last BM:  no documented BM  Height:   Ht Readings from Last 1 Encounters:  12/22/17 5\' 3"  (1.6 m)    Weight:   Wt Readings from Last 1 Encounters:  12/24/17 251 lb 8.7 oz (114.1 kg)    Ideal Body Weight:  52.3 kg  BMI:  Body mass index is 44.56 kg/m.  Estimated Nutritional Needs:   Kcal:  [id number] kcals   Protein:  105-132 g  Fluid:  >/= 1.8 L      Trenton GammonJessica Yoshi Mancillas, MS, RD, LDN, [facility] Inpatient Clinical Dietitian Pager # [phone] After hours/weekend pager # [phone]

## 2017-12-24 NOTE — Progress Notes (Signed)
PULMONARY / CRITICAL CARE MEDICINE   Name: Laura Valentine MRN: [id number] DOB: Jul 26, 1975    ADMISSION DATE:  12/22/2017 CONSULTATION DATE:  12/23/2017  REFERRING MD:  Henderson Cloud  CHIEF COMPLAINT:  Cardiac arrest  HISTORY OF PRESENT ILLNESS:   42 y/o female with asthma admitted G3 P1011 at [redacted]w[redacted]d to [facility] hospital on 8/1 in the setting of rising blood pressures.  She underwent a C-section that evening and received blood pressure medications and magnesium sulfate.  By 4401 on 8/2 she had a cardiac arrest.  She described trouble breathing prior and was then found unresponsive and pulseless.  CPR was performed and the patient received a shock during the code.  She was transferred to [facility] for further evaluation.    PAST MEDICAL HISTORY :  She  has a past medical history of Endometriosis, GERD (gastroesophageal reflux disease), HSV (herpes simplex virus) anogenital infection, and Infection.   SUBJECTIVE:  Seen by neurology, had MRI brain; initial neurology assessment favored hypertensive encephalopathy in setting of eclampsia.  Later  Cardiology saw the patient as well and there was no clear evidence of cardiac abnormalities An EEG showed burst suppresion, some myoclonus noted on exam so a repeat neurology assessment recommended treating with burst suppression due to possible status epilepticus vs mycoclonic status; eclapmsia and anoxic brain injury remain in the differential  VITAL SIGNS: BP (!) 107/59   Pulse 81   Temp 98.6 F (37 C) (Oral)   Resp 16   Ht 5\' 3"  (1.6 m)   Wt 117.8 kg (259 lb 11.2 oz)   LMP 03/29/2017   SpO2 99%   Breastfeeding? Unknown   BMI 46.00 kg/m   HEMODYNAMICS:    VENTILATOR SETTINGS: Vent Mode: PRVC FiO2 (%):  [40 %-100 %] 40 % Set Rate:  [16 bmp] 16 bmp Vt Set:  [520 mL] 520 mL PEEP:  [10 cmH20-14 cmH20] 10 cmH20 Plateau Pressure:  [20 cmH20-21 cmH20] 20 cmH20  INTAKE / OUTPUT: I/O last 3 completed shifts: In: 5426 [P.O.:840;  I.V.:3411.2; NG/GT:60; IV Piggyback:1114.7] Out: 5809 [Urine:4900; Other:360; Blood:549]  PHYSICAL EXAMINATION:  General:  In bed on vent HENT: NCAT ETT in place PULM: CTA B, vent supported breathing CV: RRR, no mgr GI: BS+, soft, nontender; c-section scar well dressed, dry, no drainage MSK: normal bulk and tone Neuro: sedated on vent   LABS:  BMET Recent Labs  Lab 12/23/17 0903 12/23/17 2242 12/24/17 0509  NA 136 135 137  K 5.8* 4.3 4.8  CL 106 103 105  CO2 22 24 25   BUN 8 12 12   CREATININE 0.89 0.81 0.71  GLUCOSE 134* 117* 106*    Electrolytes Recent Labs  Lab 12/23/17 0600 12/23/17 0903 12/23/17 2242 12/24/17 0509  CALCIUM 8.3* 7.6* 7.4* 7.2*  MG 4.1* 2.9* 4.5*  --   PHOS  --  5.7* 5.1* 4.4    CBC Recent Labs  Lab 12/23/17 0600 12/23/17 0903 12/24/17 0509  WBC 13.7* 20.2* 12.5*  HGB 13.1 11.2* 8.6*  HCT 41.1 35.5* 26.9*  PLT 299 260 224    Coag's No results for input(s): APTT, INR in the last 168 hours.  Sepsis Markers Recent Labs  Lab 12/23/17 0903 12/23/17 1407  LATICACIDVEN 2.3* 1.7    ABG Recent Labs  Lab 12/23/17 0650 12/23/17 0829 12/23/17 1016  PHART 7.245* 7.328* 7.355  PCO2ART 44.7 44.1 45.6  PO2ART 139* 54.0* 212.0*    Liver Enzymes Recent Labs  Lab 12/23/17 0600 12/23/17 0903 12/23/17 2242  AST 75*  77* 62*  ALT 50* 55* 39  ALKPHOS 170* 145* 112  BILITOT 0.4 0.5 0.6  ALBUMIN 2.4* 2.1* 2.0*    Cardiac Enzymes Recent Labs  Lab 12/23/17 0600 12/23/17 0903  TROPONINI 0.03* 0.08*    Glucose Recent Labs  Lab 12/23/17 0856 12/23/17 1129 12/23/17 1520 12/23/17 2015 12/24/17 0036 12/24/17 0402  GLUCAP 134* 152* 94 125* 113* 110*    Imaging Ct Head Wo Contrast  Result Date: 12/23/2017 CLINICAL DATA:  C-section 12/22/2017. Cardiac arrest. Preeclampsia. EXAM: CT HEAD WITHOUT CONTRAST TECHNIQUE: Contiguous axial images were obtained from the base of the skull through the vertex without intravenous  contrast. COMPARISON:  None. FINDINGS: Brain: No evidence of acute infarction, hemorrhage, hydrocephalus, extra-axial collection or mass lesion/mass effect. Negative for cerebral edema. Vascular: Negative for hyperdense vessel Skull: Negative Sinuses/Orbits: Mucosal edema paranasal sinuses with air-fluid level in the sphenoid sinus. Patient is intubated. Negative orbit. Other: None IMPRESSION: Negative CT head Paranasal sinus disease.  Patient is intubated. Electronically Signed   By: Marlan Palauharles  Clark M.D.   On: 12/23/2017 10:07   Ct Angio Chest Pe W Or Wo Contrast  Result Date: 12/23/2017 CLINICAL DATA:  PE suspected.  Chest pain. EXAM: CT ANGIOGRAPHY CHEST CT ABDOMEN AND PELVIS WITH CONTRAST TECHNIQUE: Multidetector CT imaging of the chest was performed using the standard protocol during bolus administration of intravenous contrast. Multiplanar CT image reconstructions and MIPs were obtained to evaluate the vascular anatomy. Multidetector CT imaging of the abdomen and pelvis was performed using the standard protocol during bolus administration of intravenous contrast. CONTRAST:  100mL ISOVUE-370 IOPAMIDOL (ISOVUE-370) INJECTION 76% COMPARISON:  None. FINDINGS: CTA CHEST FINDINGS Cardiovascular: Normal heart size. No pericardial effusion. Satisfactory opacification of the pulmonary arteries but limited by pervasive motion artifact. No detected pulmonary embolism, with sensitivity significantly limited beyond the segmental levels. Limited opacification of the systemic arterial tree without abnormality. Mediastinum/Nodes: No noted adenopathy, hematoma, or pneumomediastinum. Lungs/Pleura: Endotracheal tube in good position. The central airways are clear when allowing for motion. There is patchy airspace opacity throughout all lobes, somewhat peribronchovascular apically. Dense consolidation in the medial lower lobes with some volume loss. No effusion, Kerley lines, or pneumothorax. Musculoskeletal: No detected  fracture, limited by motion. Review of the MIP images confirms the above findings. CT ABDOMEN and PELVIS FINDINGS Hepatobiliary: Subcapsular lipoma along the right liver.No evidence of biliary obstruction or stone. Pancreas: Unremarkable. Spleen: Unremarkable. Adrenals/Urinary Tract: Negative adrenals. Punctate left lower pole calculus. Presumed small cyst in the left interpolar kidney. Decompressed bladder by a Foley catheter. Stomach/Bowel: Negative. No evidence of ischemia or obstruction. No inflammatory changes Vascular/Lymphatic: No acute vascular abnormality. No mass or adenopathy. Reproductive:Recently gravid uterus with expected changes of cesarean section. Tubal ligation clips. Other: No ascites or pneumoperitoneum. Musculoskeletal: Subcutaneous lipoma in the left paramedian lumbar back. No acute finding Review of the MIP images confirms the above findings. IMPRESSION: 1. Extensive bilateral airspace disease, history favoring aspiration or noncardiogenic edema over pneumonia. 2. No evidence of pulmonary embolism. Motion is a significantly limiting factor beyond the segmental levels. 3. Expected findings of recent cesarean section. No acute finding in the abdomen. 4. History of recent CPR. No visible fracture as permitted by motion. Negative for air leak. Electronically Signed   By: Marnee SpringJonathon  Watts M.D.   On: 12/23/2017 10:17   Mr Brain Wo Contrast  Result Date: 12/23/2017 CLINICAL DATA:  Post cardiac arrest after C-section. Preeclampsia. High blood pressures. Possible aspiration. EXAM: MRI HEAD WITHOUT CONTRAST MRV HEAD WITHOUT CONTRAST TECHNIQUE:  Multiplanar, multiecho pulse sequences of the brain and surrounding structures were obtained without intravenous contrast. Angiographic images of the intracranial venous structures were obtained using MRV technique without intravenous contrast. COMPARISON:  CT head 12/23/2017. FINDINGS: MRI BRAIN: Brain: No acute stroke, visible hemorrhage, mass lesion,  hydrocephalus, or extra-axial fluid. No signs of anoxic injury, or hypoxic/ischemic insult. Normal for age cerebral volume. No white matter disease. No signs of preeclampsia/eclampsia/PRES. Vessels: Flow voids are maintained. No abnormality on gradient sequence. Skull and upper cervical region: Diffuse low signal on T1 weighted images, nonspecific. Sinuses and orbits. No orbital findings. 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 Some layering fluid in the sphenoid sinus, likely related to recumbency. MRV: The superior sagittal sinus, internal cerebral veins, vein of Galen, and straight sinuses are all widely patent. No visible cortical venous thrombosis. Dominant RIGHT transverse and sigmoid sinus pattern, with both sides patent, and subsequent visualization of the BILATERAL patent and dominant RIGHT IJ. No signs of nonocclusive intrasinus thrombus on routine brain MR or axial source images. IMPRESSION: Noncontrast MRI of the brain is within normal limits. No evidence for anoxia or hypoxic/ischemic insult. No imaging signs of preeclampsia/eclampsia/PRES. MRV of the intracranial circulation demonstrates no evidence for sinovenous occlusive disease. Electronically Signed   By: Elsie Stain M.D.   On: 12/23/2017 19:48   Ct Abdomen Pelvis W Contrast  Result Date: 12/23/2017 CLINICAL DATA:  PE suspected.  Chest pain. EXAM: CT ANGIOGRAPHY CHEST CT ABDOMEN AND PELVIS WITH CONTRAST TECHNIQUE: Multidetector CT imaging of the chest was performed using the standard protocol during bolus administration of intravenous contrast. Multiplanar CT image reconstructions and MIPs were obtained to evaluate the vascular anatomy. Multidetector CT imaging of the abdomen and pelvis was performed using the standard protocol during bolus administration of intravenous contrast. CONTRAST:  ISOVUE-370 IOPAMIDOL (ISOVUE-370) INJECTION 76% COMPARISON:  None. FINDINGS: CTA CHEST FINDINGS  Cardiovascular: Normal heart size. No pericardial effusion. Satisfactory opacification of the pulmonary arteries but limited by pervasive motion artifact. No detected pulmonary embolism, with sensitivity significantly limited beyond the segmental levels. Limited opacification of the systemic arterial tree without abnormality. Mediastinum/Nodes: No noted adenopathy, hematoma, or pneumomediastinum. Lungs/Pleura: Endotracheal tube in good position. The central airways are clear when allowing for motion. There is patchy airspace opacity throughout all lobes, somewhat peribronchovascular apically. Dense consolidation in the medial lower lobes with some volume loss. No effusion, Kerley lines, or pneumothorax. Musculoskeletal: No detected fracture, limited by motion. Review of the MIP images confirms the above findings. CT ABDOMEN and PELVIS FINDINGS Hepatobiliary: Subcapsular lipoma along the right liver.No evidence of biliary obstruction or stone. Pancreas: Unremarkable. Spleen: Unremarkable. Adrenals/Urinary Tract: Negative adrenals. Punctate left lower pole calculus. Presumed small cyst in the left interpolar kidney. Decompressed bladder by a Foley catheter. Stomach/Bowel: Negative. No evidence of ischemia or obstruction. No inflammatory changes Vascular/Lymphatic: No acute vascular abnormality. No mass or adenopathy. Reproductive:Recently gravid uterus with expected changes of cesarean section. Tubal ligation clips. Other: No ascites or pneumoperitoneum. Musculoskeletal: Subcutaneous lipoma in the left paramedian lumbar back. No acute finding Review of the MIP images confirms the above findings. IMPRESSION: 1. Extensive bilateral airspace disease, history favoring aspiration or noncardiogenic edema over pneumonia. 2. No evidence of pulmonary embolism. Motion is a significantly limiting factor beyond the segmental levels. 3. Expected findings of recent cesarean section. No acute finding in the abdomen. 4. History of  recent CPR. No visible fracture as permitted by motion. Negative for air leak. Electronically Signed  By: Marnee Spring M.D.   On: 12/23/2017 10:17   Dg Chest Port 1 View  Result Date: 12/24/2017 CLINICAL DATA:  Respiratory failure EXAM: PORTABLE CHEST 1 VIEW COMPARISON:  12/23/2017 FINDINGS: Cardiac shadow is stable. Endotracheal tube, nasogastric catheter and left jugular catheter are again seen. Bilateral infiltrates are again noted although slightly improved when compared with the prior study. No sizable effusion is seen. No bony abnormality is noted. IMPRESSION: Slight improvement in bilateral infiltrates when compared with the prior exam. Electronically Signed   By: Alcide Clever M.D.   On: 12/24/2017 07:56   Dg Chest Port 1 View  Result Date: 12/23/2017 CLINICAL DATA:  Status post intubation EXAM: PORTABLE CHEST 1 VIEW COMPARISON:  12/23/2017 FINDINGS: Cardiac shadow is stable. Endotracheal tube and nasogastric catheter are noted in satisfactory position. Diffuse bilateral infiltrates are again identified and stable given some technical variations in the imaging. No new focal abnormality is seen. IMPRESSION: Stable bilateral infiltrates. Tubes and lines as described. Electronically Signed   By: Alcide Clever M.D.   On: 12/23/2017 08:42   Dg Chest Port 1v Same Day  Result Date: 12/23/2017 CLINICAL DATA:  Check central line placement EXAM: PORTABLE CHEST 1 VIEW COMPARISON:  Film from earlier in the same day. FINDINGS: Endotracheal tube is again identified and stable. Nasogastric catheter has been removed. Left jugular central line is noted in the proximal superior vena cava. No pneumothorax is seen. Diffuse bilateral infiltrates are identified and stable. No bony abnormality is noted. IMPRESSION: Tubes and lines as described above. Stable bilateral infiltrates. Electronically Signed   By: Alcide Clever M.D.   On: 12/23/2017 13:36   Dg Abd Portable 1v  Result Date: 12/24/2017 CLINICAL DATA:  NG tube  placement EXAM: PORTABLE ABDOMEN - 1 VIEW COMPARISON:  12/23/2017 FINDINGS: NG tube tip is positioned in the mid stomach. Basilar airspace disease noted in the lungs bilaterally. Bowel gas pattern is nonspecific. IMPRESSION: NG tube tip is in the mid stomach. Electronically Signed   By: Kennith Center M.D.   On: 12/24/2017 01:34   Dg Abd Portable 1v  Result Date: 12/23/2017 CLINICAL DATA:  Check gastric catheter placement EXAM: PORTABLE ABDOMEN - 1 VIEW COMPARISON:  Films from earlier in the same day. FINDINGS: Previously seen nasogastric catheter is no longer identified. Scattered large and small bowel gas is noted. Contrast material is noted within the renal collecting systems consistent with the recent CT. IMPRESSION: No gastric catheter is identified. Clinical correlation is recommended. Electronically Signed   By: Alcide Clever M.D.   On: 12/23/2017 13:37   Mr Mrv Head Wo Cm  Result Date: 12/23/2017 CLINICAL DATA:  Post cardiac arrest after C-section. Preeclampsia. High blood pressures. Possible aspiration. EXAM: MRI HEAD WITHOUT CONTRAST MRV HEAD WITHOUT CONTRAST TECHNIQUE: Multiplanar, multiecho pulse sequences of the brain and surrounding structures were obtained without intravenous contrast. Angiographic images of the intracranial venous structures were obtained using MRV technique without intravenous contrast. COMPARISON:  CT head 12/23/2017. FINDINGS: MRI BRAIN: Brain: No acute stroke, visible hemorrhage, mass lesion, hydrocephalus, or extra-axial fluid. No signs of anoxic injury, or hypoxic/ischemic insult. Normal for age cerebral volume. No white matter disease. No signs of preeclampsia/eclampsia/PRES. Vessels: Flow voids are maintained. No abnormality on gradient sequence. Skull and upper cervical region: Diffuse low signal on T1 weighted images, nonspecific. Sinuses and orbits. No orbital findings. 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 Some layering  fluid in the sphenoid sinus, likely related to recumbency.  MRV: The superior sagittal sinus, internal cerebral veins, vein of Galen, and straight sinuses are all widely patent. No visible cortical venous thrombosis. Dominant RIGHT transverse and sigmoid sinus pattern, with both sides patent, and subsequent visualization of the BILATERAL patent and dominant RIGHT IJ. No signs of nonocclusive intrasinus thrombus on routine brain MR or axial source images. IMPRESSION: Noncontrast MRI of the brain is within normal limits. No evidence for anoxia or hypoxic/ischemic insult. No imaging signs of preeclampsia/eclampsia/PRES. MRV of the intracranial circulation demonstrates no evidence for sinovenous occlusive disease. Electronically Signed   By: Elsie Stain M.D.   On: 12/23/2017 19:48     STUDIES:  8/2 EEG > burst suppression pattern 8/2 CT angiogram chest> no pe, extensive bilateral airspace disease either due to aspiration or pulmonary edema 8/2 CT abdomen> expected findings post c-section 8/2 CT head > negative, some paranasal sinus disease 8/2 non contrast MRI brain within normal limits, no evidence of anoxia, no findings of PRES, MRV showed no sinovenous occlusive disease 8/2 Echo> LVEF normal  CULTURES: 8/2 resp culture GPC in pairs 8/2 blood >   ANTIBIOTICS: 8/2 vanc >  8/2 cefepime> 8/3 8/2 flagyl > 8/3 8/3 aztreonam >  SIGNIFICANT EVENTS: 8/1 elective C-section in setting of hypertension 38 weeks 8/2 cardiac arrest  LINES/TUBES: 8/2 ETT 8/2 L IJ CVL   DISCUSSION: 42 y/o female with cardiac arrest after elective c-section for pre-eclampsia.  Given hypertension, frothy edema from ETT, bilateral infiltrates I think that acute pulmonary edema is most likely vs aspiration.  Ongoing abnormal EEG activity being treated as eclampsia with burst suppression and magnesium. Concern for anoxic brain injury remains.   ASSESSMENT / PLAN:  PULMONARY A: Acute respiratory failure with  hypoxemia Acute pulmonary edema Possible aspiration pneumonia P:   Full mechanical vent support VAP prevention Daily WUA/SBT Consider extubation when mental status improves  CARDIOVASCULAR A:  Eclampsia/hypertension> resolved Hypotension in setting of high sedation, UOP adequate, no evidence of end organ damage P:  Tele Track CVP, goal 8-12 Continue neosynephrine for MAP > 80 Monitor UOP closely Minimize crystalloid to prevent edema Continue magnesium drip  RENAL A:   No acute issues P:   Monitor BMET and UOP Replace electrolytes as needed   GASTROINTESTINAL A:   Mild shock liver, improving P:   Track LFT Start tube feeding Continue medical stress ulcer prophylaxis    HEMATOLOGIC A:   Dropping hemoglobin without evidnece of bleeding, suspect dilutional? No evidence of DIC P:  Check LDH, Haptoglobin to evaluate for hemolysis (doubt) Monitor for bleeding Transfuse PRBC for Hgb < 7 gm/dL  INFECTIOUS A:   Aspiration pneumonia P:   Stop cefepime for seizure risk Start aztreonam Continue Vancomycin  ENDOCRINE A:   No acute issues P:   Monitor glucose  NEUROLOGIC A:   Seizures Concern for hypertensive encephalopathy Concern for anoxic brain injury P:   Burst suppressive doses of versed and propofol Continuous EEG monitoring Will remain heavily sedated with burst suppression for > 24 hours Will assess neuro status formally off of sedation when able to wean burst suppressive medications MAP goal > 80 Neuro normothermia protocol phase 1, may need arctic sun if fever  FAMILY  - Updates: will update family bedside  - Inter-disciplinary family meet or Palliative Care meeting due by:  Day 7   My cc time 65 minutes  Heber [place], MD [facility] Pager: [phone] Cell: [phone] After 3pm or if no response, call [phone]   12/24/2017, 8:02 AM

## 2017-12-25 ENCOUNTER — Inpatient Hospital Stay (HOSPITAL_COMMUNITY): Payer: Managed Care, Other (non HMO)

## 2017-12-25 DIAGNOSIS — G40901 Epilepsy, unspecified, not intractable, with status epilepticus: Secondary | ICD-10-CM

## 2017-12-25 LAB — CBC
HCT: 25 % — ABNORMAL LOW (ref 36.0–46.0)
Hemoglobin: 7.5 g/dL — ABNORMAL LOW (ref 12.0–15.0)
MCH: 25.3 pg — AB (ref 26.0–34.0)
MCHC: 30 g/dL (ref 30.0–36.0)
MCV: 84.5 fL (ref 78.0–100.0)
PLATELETS: 212 10*3/uL (ref 150–400)
RBC: 2.96 MIL/uL — ABNORMAL LOW (ref 3.87–5.11)
RDW: 16 % — ABNORMAL HIGH (ref 11.5–15.5)
WBC: 8.6 10*3/uL (ref 4.0–10.5)

## 2017-12-25 LAB — CULTURE, RESPIRATORY: CULTURE: NORMAL

## 2017-12-25 LAB — CULTURE, RESPIRATORY W GRAM STAIN

## 2017-12-25 LAB — GLUCOSE, CAPILLARY
GLUCOSE-CAPILLARY: 73 mg/dL (ref 70–99)
GLUCOSE-CAPILLARY: 73 mg/dL (ref 70–99)
GLUCOSE-CAPILLARY: 71 mg/dL (ref 70–99)
GLUCOSE-CAPILLARY: 148 mg/dL — AB (ref 70–99)
GLUCOSE-CAPILLARY: 64 mg/dL — AB (ref 70–99)
Glucose-Capillary: 72 mg/dL (ref 70–99)
Glucose-Capillary: 76 mg/dL (ref 70–99)
Glucose-Capillary: 91 mg/dL (ref 70–99)

## 2017-12-25 LAB — BASIC METABOLIC PANEL
ANION GAP: 5 (ref 5–15)
BUN: 5 mg/dL — ABNORMAL LOW (ref 6–20)
CO2: 26 mmol/L (ref 22–32)
Calcium: 7.2 mg/dL — ABNORMAL LOW (ref 8.9–10.3)
Chloride: 110 mmol/L (ref 98–111)
Creatinine, Ser: 0.56 mg/dL (ref 0.44–1.00)
GFR calc Af Amer: >60 mL/min (ref >60)
GFR calc non Af Amer: >60 mL/min (ref >60)
GLUCOSE: 79 mg/dL (ref 70–99)
POTASSIUM: 5 mmol/L (ref 3.5–5.1)
Sodium: 141 mmol/L (ref 135–145)

## 2017-12-25 LAB — HEPATIC FUNCTION PANEL
ALBUMIN: 1.7 g/dL — AB (ref 3.5–5.0)
ALT: 26 U/L (ref 0–44)
AST: 42 U/L — ABNORMAL HIGH (ref 15–41)
Alkaline Phosphatase: 94 U/L (ref 38–126)
BILIRUBIN INDIRECT: 0.3 mg/dL (ref 0.3–0.9)
Bilirubin, Direct: 0.2 mg/dL (ref 0.0–0.2)
Total Bilirubin: 0.5 mg/dL (ref 0.3–1.2)
Total Protein: 4.2 g/dL — ABNORMAL LOW (ref 6.5–8.1)

## 2017-12-25 LAB — HAPTOGLOBIN: Haptoglobin: 102 mg/dL (ref 34–200)

## 2017-12-25 LAB — MAGNESIUM
Magnesium: 4.5 mg/dL — ABNORMAL HIGH (ref 1.7–2.4)
Magnesium: 2.8 mg/dL — ABNORMAL HIGH (ref 1.7–2.4)

## 2017-12-25 LAB — PHOSPHORUS
PHOSPHORUS: 4.5 mg/dL (ref 2.5–4.6)
PHOSPHORUS: 3.8 mg/dL (ref 2.5–4.6)

## 2017-12-25 LAB — CALCIUM, IONIZED: CALCIUM, IONIZED, SERUM: 4.2 mg/dL — AB (ref 4.5–5.6)

## 2017-12-25 MED ORDER — CHLORHEXIDINE GLUCONATE CLOTH 2 % EX PADS
6.0000 | MEDICATED_PAD | Freq: Every day | CUTANEOUS | Status: DC
  Administered 2017-12-25 – 2018-01-01 (×3): 6 via TOPICAL

## 2017-12-25 MED ORDER — FENTANYL CITRATE (PF) 2500 MCG/50ML IJ SOLN
25.0000 ug/h | Status: DC
  Administered 2017-12-25 (×2): 150 ug/h via INTRAVENOUS
  Administered 2017-12-26: 25 ug/h via INTRAVENOUS
  Administered 2017-12-27: 50 ug/h via INTRAVENOUS
  Filled 2017-12-25 (×2): qty 100

## 2017-12-25 MED ORDER — SODIUM CHLORIDE 0.9 % IV SOLN
20.0000 mg/h | INTRAVENOUS | Status: AC
  Administered 2017-12-25: 20 mg/h via INTRAVENOUS
  Filled 2017-12-25: qty 50

## 2017-12-25 MED ORDER — MEASLES, MUMPS & RUBELLA VAC ~~LOC~~ INJ: 0.5000 mL | INJECTION | SUBCUTANEOUS | Status: AC | PRN

## 2017-12-25 MED ORDER — FENTANYL CITRATE (PF) 100 MCG/2ML IJ SOLN: 50.0000 ug | Freq: Once | INTRAMUSCULAR | Status: DC

## 2017-12-25 MED ORDER — SODIUM CHLORIDE 0.9 % IV SOLN
20.0000 mg/h | INTRAVENOUS | Status: DC
  Filled 2017-12-25: qty 20

## 2017-12-25 MED ORDER — TETANUS-DIPHTH-ACELL PERTUSSIS 5-2.5-18.5 LF-MCG/0.5 IM SUSP: 0.5000 mL | INTRAMUSCULAR | Status: AC | PRN

## 2017-12-25 MED ORDER — DEXTROSE 50 % IV SOLN
INTRAVENOUS | Status: AC
  Filled 2017-12-25: qty 50

## 2017-12-25 MED ORDER — FENTANYL BOLUS VIA INFUSION
25.0000 ug | INTRAVENOUS | Status: DC | PRN
  Filled 2017-12-25: qty 25

## 2017-12-25 MED ORDER — DEXTROSE 50 % IV SOLN
50.0000 mL | Freq: Once | INTRAVENOUS | Status: AC
  Administered 2017-12-25: 50 mL via INTRAVENOUS

## 2017-12-25 MED ORDER — FAMOTIDINE IN NACL 20-0.9 MG/50ML-% IV SOLN
20.0000 mg | Freq: Two times a day (BID) | INTRAVENOUS | Status: DC
  Administered 2017-12-25 – 2018-01-02 (×16): 20 mg via INTRAVENOUS
  Filled 2017-12-25 (×16): qty 50

## 2017-12-25 MED ORDER — SODIUM CHLORIDE 0.9% FLUSH: 10.0000 mL | INTRAVENOUS | Status: DC | PRN

## 2017-12-25 MED ORDER — SODIUM CHLORIDE 0.9 % IV SOLN: 0.5000 mg/h | INTRAVENOUS | Status: DC

## 2017-12-25 MED ORDER — SODIUM CHLORIDE 0.9% FLUSH
10.0000 mL | Freq: Two times a day (BID) | INTRAVENOUS | Status: DC
  Administered 2017-12-25 – 2018-01-02 (×13): 10 mL

## 2017-12-25 MED ORDER — WHITE PETROLATUM EX OINT
TOPICAL_OINTMENT | CUTANEOUS | Status: DC | PRN
  Administered 2017-12-28: 0.2 via TOPICAL
  Filled 2017-12-25 (×2): qty 28.35

## 2017-12-25 MED ORDER — SODIUM CHLORIDE 0.9 % IV SOLN
20.0000 mg/h | INTRAVENOUS | Status: DC
  Administered 2017-12-26: 5 mg/h via INTRAVENOUS
  Filled 2017-12-25: qty 50

## 2017-12-25 NOTE — Progress Notes (Addendum)
Subjective: Intubated and sedated on propofol and Versed gtt  Objective: Current vital signs: BP 134/66 (BP Location: Right Arm)   Pulse 72   Temp 99 F (37.2 C) (Esophageal)   Resp 16   Ht 5' 3"  (1.6 m)   Wt 115.1 kg (253 lb 12 oz)   LMP 03/29/2017   SpO2 99%   Breastfeeding? Unknown   BMI 44.95 kg/m  Vital signs in last 24 hours: Temp:  [97.5 F (36.4 C)-99.1 F (37.3 C)] 99 F (37.2 C) (08/04 0800) Pulse Rate:  [65-91] 72 (08/04 0800) Resp:  [7-30] 16 (08/04 0800) BP: (89-148)/(39-85) 134/66 (08/04 0800) SpO2:  [94 %-100 %] 99 % (08/04 0800) FiO2 (%):  [40 %] 40 % (08/04 0732) Weight:  [115.1 kg (253 lb 12 oz)] 115.1 kg (253 lb 12 oz) (08/04 0300)  Intake/Output from previous day: 08/03 0701 - 08/04 0700 In: 10852.6 [I.V.:5933.5; NG/GT:175; IV Piggyback:4744.2] Out: 3875 [Urine:3875] Intake/Output this shift: Total I/O In: 171.5 [I.V.:171.5] Out: 250 [Urine:250] Nutritional status:  Diet Order           Diet NPO time specified  Diet effective now          Neurologic Exam: Ment: Sedated and unresponsive to all stimuli.  CN: Pupils unreactive with no doll's eye reflex. Face flaccidly symmetric.  Motor/Sensory: Flaccid tone with no responses to stimulation. No myoclonic activity noted.   Lab Results: Results for orders placed or performed during the hospital encounter of 12/22/17 (from the past 48 hour(s))  Magnesium     Status: Abnormal   Collection Time: 12/23/17  9:03 AM  Result Value Ref Range   Magnesium 2.9 (H) 1.7 - 2.4 mg/dL    Comment: Performed at [facility] [address] [place], [place] [address]  Phosphorus     Status: Abnormal   Collection Time: 12/23/17  9:03 AM  Result Value Ref Range   Phosphorus 5.7 (H) 2.5 - 4.6 mg/dL    Comment: Performed at [facility] [address], [place], [place] [address]  CBC with Differential/Platelet     Status: Abnormal   Collection Time: 12/23/17  9:03 AM  Result Value Ref Range   WBC  20.2 (H) 4.0 - 10.5 K/uL   RBC 4.48 3.87 - 5.11 MIL/uL   Hemoglobin 11.2 (L) 12.0 - 15.0 g/dL   HCT 35.5 (L) 36.0 - 46.0 %   MCV 79.2 78.0 - 100.0 fL   MCH 25.0 (L) 26.0 - 34.0 pg   MCHC 31.5 30.0 - 36.0 g/dL   RDW 14.6 11.5 - 15.5 %   Platelets 260 150 - 400 K/uL   Neutrophils Relative % 90 %   Neutro Abs 18.2 (H) 1.7 - 7.7 K/uL   Lymphocytes Relative 5 %   Lymphs Abs 1.0 0.7 - 4.0 K/uL   Monocytes Relative 4 %   Monocytes Absolute 0.8 0.1 - 1.0 K/uL   Eosinophils Relative 0 %   Eosinophils Absolute 0.0 0.0 - 0.7 K/uL   Basophils Relative 0 %   Basophils Absolute 0.0 0.0 - 0.1 K/uL   Immature Granulocytes 1 %   Abs Immature Granulocytes 0.2 (H) 0.0 - 0.1 K/uL    Comment: Performed at [facility] [address] [place], [place] [address]  Comprehensive metabolic panel     Status: Abnormal   Collection Time: 12/23/17  9:03 AM  Result Value Ref Range   Sodium 136 135 - 145 mmol/L   Potassium 5.8 (  H) 3.5 - 5.1 mmol/L   Chloride 106 98 - 111 mmol/L   CO2 22 22 - 32 mmol/L   Glucose, Bld 134 (H) 70 - 99 mg/dL   BUN 8 6 - 20 mg/dL   Creatinine, Ser 0.89 0.44 - 1.00 mg/dL   Calcium 7.6 (L) 8.9 - 10.3 mg/dL   Total Protein 5.0 (L) 6.5 - 8.1 g/dL   Albumin 2.1 (L) 3.5 - 5.0 g/dL   AST 77 (H) 15 - 41 U/L   ALT 55 (H) 0 - 44 U/L   Alkaline Phosphatase 145 (H) 38 - 126 U/L   Total Bilirubin 0.5 0.3 - 1.2 mg/dL   GFR calc non Af Amer >60 >60 mL/min   GFR calc Af Amer >60 >60 mL/min    Comment: (NOTE) The eGFR has been calculated using the CKD EPI equation. This calculation has not been validated in all clinical situations. eGFR's persistently <60 mL/min signify possible Chronic Kidney Disease.    Anion gap 8 5 - 15    Comment: Performed at [facility] [address] [place], [place] [address]  Troponin I     Status: Abnormal   Collection Time: 12/23/17  9:03 AM  Result Value Ref Range   Troponin I 0.08 (HH) <0.03 ng/mL    Comment: CRITICAL RESULT CALLED TO,  READ BACK BY AND VERIFIED WITH: R PATERNOSTER RN [id number] 12/23/2017 BY A BENNETT Performed at [facility], [place] [address] [place], [place] [address]   Lactic acid, plasma     Status: Abnormal   Collection Time: 12/23/17  9:03 AM  Result Value Ref Range   Lactic Acid, Venous 2.3 (HH) 0.5 - 1.9 mmol/L    Comment: CRITICAL RESULT CALLED TO, READ BACK BY AND VERIFIED WITH: R PATERNOSTER RN [id number] 12/23/2017 BY A BENNETT Performed at [facility], [place] [address] [place], [place] [address]   Triglycerides     Status: Abnormal   Collection Time: 12/23/17  9:03 AM  Result Value Ref Range   Triglycerides 291 (H) <150 mg/dL    Comment: Performed at [facility] [address], [place], [place] [address]  I-STAT 3, arterial blood gas (G3+)     Status: Abnormal   Collection Time: 12/23/17 10:16 AM  Result Value Ref Range   pH, Arterial 7.355 7.350 - 7.450   pCO2 arterial 45.6 32.0 - 48.0 mmHg   pO2, Arterial 212.0 (H) 83.0 - 108.0 mmHg   Bicarbonate 25.7 20.0 - 28.0 mmol/L   TCO2 27 22 - 32 mmol/L   O2 Saturation 100.0 %   Patient temperature 36.1 C    Collection site RADIAL, ALLEN'S TEST ACCEPTABLE    Drawn by VP    Sample type ARTERIAL   Culture, blood (single)     Status: None (Preliminary result)   Collection Time: 12/23/17 10:35 AM  Result Value Ref Range   Specimen Description BLOOD BLOOD RIGHT HAND    Special Requests      BOTTLES DRAWN AEROBIC AND ANAEROBIC Blood Culture adequate volume   Culture      NO GROWTH 1 DAY Performed at [facility], [place] [address], [place], [place] [address]    Report Status PENDING   Culture, blood (single)     Status: None (Preliminary result)   Collection Time: 12/23/17 10:43 AM  Result Value Ref Range   Specimen Description BLOOD BLOOD LEFT HAND    Special Requests  BOTTLES DRAWN AEROBIC AND ANAEROBIC Blood Culture results may not be optimal due to an inadequate volume of blood received in culture bottles   Culture       NO GROWTH 1 DAY Performed at [facility] [address] [place], [place] [address]    Report Status PENDING   Glucose, capillary     Status: Abnormal   Collection Time: 12/23/17 11:29 AM  Result Value Ref Range   Glucose-Capillary 152 (H) 70 - 99 mg/dL  Lactic acid, plasma     Status: None   Collection Time: 12/23/17  2:07 PM  Result Value Ref Range   Lactic Acid, Venous 1.7 0.5 - 1.9 mmol/L    Comment: Performed at [facility] [address], [place], [place] [address]  Glucose, capillary     Status: None   Collection Time: 12/23/17  3:20 PM  Result Value Ref Range   Glucose-Capillary 94 70 - 99 mg/dL  Culture, respiratory (non-expectorated)     Status: None (Preliminary result)   Collection Time: 12/23/17  4:36 PM  Result Value Ref Range   Specimen Description TRACHEAL ASPIRATE    Special Requests NONE    Gram Stain      FEW WBC PRESENT, PREDOMINANTLY PMN RARE GRAM POSITIVE COCCI IN PAIRS    Culture      CULTURE REINCUBATED FOR BETTER GROWTH Performed at [facility], [place] [address], [place], [place] [address]    Report Status PENDING   Glucose, capillary     Status: Abnormal   Collection Time: 12/23/17  8:15 PM  Result Value Ref Range   Glucose-Capillary 125 (H) 70 - 99 mg/dL  Comprehensive metabolic panel     Status: Abnormal   Collection Time: 12/23/17 10:42 PM  Result Value Ref Range   Sodium 135 135 - 145 mmol/L   Potassium 4.3 3.5 - 5.1 mmol/L   Chloride 103 98 - 111 mmol/L   CO2 24 22 - 32 mmol/L   Glucose, Bld 117 (H) 70 - 99 mg/dL   BUN 12 6 - 20 mg/dL   Creatinine, Ser 0.81 0.44 - 1.00 mg/dL   Calcium 7.4 (L) 8.9 - 10.3 mg/dL   Total Protein 4.4 (L) 6.5 - 8.1 g/dL   Albumin 2.0 (L) 3.5 - 5.0 g/dL   AST 62 (H) 15 - 41 U/L   ALT 39 0 - 44 U/L   Alkaline Phosphatase 112 38 - 126 U/L   Total Bilirubin 0.6 0.3 - 1.2 mg/dL   GFR calc non Af Amer >60 >60 mL/min   GFR calc Af Amer >60 >60 mL/min    Comment: (NOTE) The eGFR has  been calculated using the CKD EPI equation. This calculation has not been validated in all clinical situations. eGFR's persistently <60 mL/min signify possible Chronic Kidney Disease.    Anion gap 8 5 - 15    Comment: Performed at [facility] [address], [place], [place] [address]  Magnesium     Status: Abnormal   Collection Time: 12/23/17 10:42 PM  Result Value Ref Range   Magnesium 4.5 (H) 1.7 - 2.4 mg/dL    Comment: Performed at [facility] [address] [place], [place] [address]  Phosphorus     Status: Abnormal   Collection Time: 12/23/17 10:42 PM  Result Value Ref Range   Phosphorus 5.1 (H) 2.5 - 4.6 mg/dL    Comment: Performed at [facility]  [address] [place], [place] [address]  Glucose, capillary     Status: Abnormal   Collection Time: 12/24/17 12:36 AM  Result Value Ref Range   Glucose-Capillary 113 (H) 70 - 99 mg/dL  Glucose, capillary     Status: Abnormal   Collection Time: 12/24/17  4:02 AM  Result Value Ref Range   Glucose-Capillary 110 (H) 70 - 99 mg/dL  MRSA PCR Screening     Status: None   Collection Time: 12/24/17  4:14 AM  Result Value Ref Range   MRSA by PCR NEGATIVE NEGATIVE    Comment:        The GeneXpert MRSA Assay (FDA approved for NASAL specimens only), is one component of a comprehensive MRSA colonization surveillance program. It is not intended to diagnose MRSA infection nor to guide or monitor treatment for MRSA infections. Performed at [facility], [place] [address], [place], [place] [address]   Basic metabolic panel     Status: Abnormal   Collection Time: 12/24/17  5:09 AM  Result Value Ref Range   Sodium 137 135 - 145 mmol/L   Potassium 4.8 3.5 - 5.1 mmol/L    Comment: SPECIMEN HEMOLYZED. HEMOLYSIS MAY AFFECT INTEGRITY OF RESULTS.   Chloride 105 98 - 111 mmol/L   CO2 25 22 - 32 mmol/L   Glucose, Bld 106 (H) 70 - 99 mg/dL   BUN 12 6 - 20 mg/dL   Creatinine, Ser 0.71 0.44 - 1.00 mg/dL   Calcium  7.2 (L) 8.9 - 10.3 mg/dL   GFR calc non Af Amer >60 >60 mL/min   GFR calc Af Amer >60 >60 mL/min    Comment: (NOTE) The eGFR has been calculated using the CKD EPI equation. This calculation has not been validated in all clinical situations. eGFR's persistently <60 mL/min signify possible Chronic Kidney Disease.    Anion gap 7 5 - 15    Comment: Performed at [facility] [address] [place], [place] [address]  Phosphorus     Status: None   Collection Time: 12/24/17  5:09 AM  Result Value Ref Range   Phosphorus 4.4 2.5 - 4.6 mg/dL    Comment: Performed at [facility] [address], [place], [place] [address]  CBC     Status: Abnormal   Collection Time: 12/24/17  5:09 AM  Result Value Ref Range   WBC 12.5 (H) 4.0 - 10.5 K/uL   RBC 3.27 (L) 3.87 - 5.11 MIL/uL   Hemoglobin 8.6 (L) 12.0 - 15.0 g/dL    Comment: REPEATED TO VERIFY DELTA CHECK NOTED    HCT 26.9 (L) 36.0 - 46.0 %   MCV 82.3 78.0 - 100.0 fL   MCH 26.3 26.0 - 34.0 pg   MCHC 32.0 30.0 - 36.0 g/dL   RDW 15.6 (H) 11.5 - 15.5 %   Platelets 224 150 - 400 K/uL    Comment: Performed at [facility], [place] [address], [place], [place] [address]  Glucose, capillary     Status: None   Collection Time: 12/24/17  8:04 AM  Result Value Ref Range   Glucose-Capillary 96 70 - 99 mg/dL  Magnesium     Status: Abnormal   Collection Time: 12/24/17  9:11 AM  Result Value Ref Range   Magnesium 5.0 (H) 1.7 - 2.4 mg/dL    Comment: Performed at [facility] [address] [place],  [address]  Phosphorus     Status: None  Collection Time: 12/24/17  9:11 AM  Result Value Ref Range   Phosphorus 4.5 2.5 - 4.6 mg/dL    Comment: Performed at [facility], [place] [address], [place], [place] [address]  Lactate dehydrogenase     Status: Abnormal   Collection Time: 12/24/17  9:11 AM  Result Value Ref Range   LDH 306 (H) 98 - 192 U/L    Comment: Performed at [facility], [place] [address],  [place], [place] [address]  Glucose, capillary     Status: None   Collection Time: 12/24/17 12:00 PM  Result Value Ref Range   Glucose-Capillary 89 70 - 99 mg/dL  Glucose, capillary     Status: None   Collection Time: 12/24/17  4:09 PM  Result Value Ref Range   Glucose-Capillary 85 70 - 99 mg/dL  Phosphorus     Status: None   Collection Time: 12/24/17  6:26 PM  Result Value Ref Range   Phosphorus 4.3 2.5 - 4.6 mg/dL    Comment: Performed at [facility], [place] [address], [place], [place] [address]  CBC with Differential/Platelet     Status: Abnormal   Collection Time: 12/24/17  6:26 PM  Result Value Ref Range   WBC 10.6 (H) 4.0 - 10.5 K/uL   RBC 3.07 (L) 3.87 - 5.11 MIL/uL   Hemoglobin 7.8 (L) 12.0 - 15.0 g/dL   HCT 25.9 (L) 36.0 - 46.0 %   MCV 84.4 78.0 - 100.0 fL   MCH 25.4 (L) 26.0 - 34.0 pg   MCHC 30.1 30.0 - 36.0 g/dL   RDW 15.9 (H) 11.5 - 15.5 %   Platelets 237 150 - 400 K/uL   Neutrophils Relative % 71 %   Neutro Abs 7.5 1.7 - 7.7 K/uL   Lymphocytes Relative 21 %   Lymphs Abs 2.2 0.7 - 4.0 K/uL   Monocytes Relative 7 %   Monocytes Absolute 0.7 0.1 - 1.0 K/uL   Eosinophils Relative 1 %   Eosinophils Absolute 0.1 0.0 - 0.7 K/uL   Basophils Relative 1 %   Basophils Absolute 0.1 0.0 - 0.1 K/uL   Immature Granulocytes 1 %   Abs Immature Granulocytes 0.1 0.0 - 0.1 K/uL    Comment: Performed at [facility], [address] [place], [place] [address]  Basic metabolic panel     Status: Abnormal   Collection Time: 12/24/17  6:26 PM  Result Value Ref Range   Sodium 139 135 - 145 mmol/L   Potassium 4.7 3.5 - 5.1 mmol/L   Chloride 110 98 - 111 mmol/L   CO2 24 22 - 32 mmol/L   Glucose, Bld 102 (H) 70 - 99 mg/dL   BUN 8 6 - 20 mg/dL   Creatinine, Ser 0.69 0.44 - 1.00 mg/dL   Calcium 7.1 (L) 8.9 - 10.3 mg/dL   GFR calc non Af Amer >60 >60 mL/min   GFR calc Af Amer >60 >60 mL/min    Comment: (NOTE) The eGFR has been calculated using the CKD EPI equation. This  calculation has not been validated in all clinical situations. eGFR's persistently <60 mL/min signify possible Chronic Kidney Disease.    Anion gap 5 5 - 15    Comment: Performed at [facility] [address], [place], [place] [address]  Glucose, capillary     Status: None   Collection Time: 12/24/17  8:02 PM  Result Value Ref Range   Glucose-Capillary 80 70 - 99  mg/dL  Magnesium     Status: Abnormal   Collection Time: 12/24/17  9:49 PM  Result Value Ref Range   Magnesium 4.6 (H) 1.7 - 2.4 mg/dL    Comment: Performed at [facility] [address] [place], [place] [address]  Glucose, capillary     Status: None   Collection Time: 12/24/17 11:41 PM  Result Value Ref Range   Glucose-Capillary 91 70 - 99 mg/dL  Glucose, capillary     Status: None   Collection Time: 12/25/17  3:45 AM  Result Value Ref Range   Glucose-Capillary 73 70 - 99 mg/dL  Phosphorus     Status: None   Collection Time: 12/25/17  4:36 AM  Result Value Ref Range   Phosphorus 4.5 2.5 - 4.6 mg/dL    Comment: Performed at [facility], [place] [address] [place], [place] [address]  Basic metabolic panel     Status: Abnormal   Collection Time: 12/25/17  4:36 AM  Result Value Ref Range   Sodium 141 135 - 145 mmol/L   Potassium 5.0 3.5 - 5.1 mmol/L   Chloride 110 98 - 111 mmol/L   CO2 26 22 - 32 mmol/L   Glucose, Bld 79 70 - 99 mg/dL   BUN 5 (L) 6 - 20 mg/dL   Creatinine, Ser 0.56 0.44 - 1.00 mg/dL   Calcium 7.2 (L) 8.9 - 10.3 mg/dL   GFR calc non Af Amer >60 >60 mL/min   GFR calc Af Amer >60 >60 mL/min    Comment: (NOTE) The eGFR has been calculated using the CKD EPI equation. This calculation has not been validated in all clinical situations. eGFR's persistently <60 mL/min signify possible Chronic Kidney Disease.    Anion gap 5 5 - 15    Comment: Performed at [facility] [address], [place], [place] [address]  CBC     Status: Abnormal   Collection Time: 12/25/17  4:36 AM   Result Value Ref Range   WBC 8.6 4.0 - 10.5 K/uL   RBC 2.96 (L) 3.87 - 5.11 MIL/uL   Hemoglobin 7.5 (L) 12.0 - 15.0 g/dL   HCT 25.0 (L) 36.0 - 46.0 %   MCV 84.5 78.0 - 100.0 fL   MCH 25.3 (L) 26.0 - 34.0 pg   MCHC 30.0 30.0 - 36.0 g/dL   RDW 16.0 (H) 11.5 - 15.5 %   Platelets 212 150 - 400 K/uL    Comment: Performed at [facility], [place] [address] [place], [place] [address]  Hepatic function panel     Status: Abnormal   Collection Time: 12/25/17  4:36 AM  Result Value Ref Range   Total Protein 4.2 (L) 6.5 - 8.1 g/dL   Albumin 1.7 (L) 3.5 - 5.0 g/dL   AST 42 (H) 15 - 41 U/L   ALT 26 0 - 44 U/L   Alkaline Phosphatase 94 38 - 126 U/L   Total Bilirubin 0.5 0.3 - 1.2 mg/dL   Bilirubin, Direct 0.2 0.0 - 0.2 mg/dL   Indirect Bilirubin 0.3 0.3 - 0.9 mg/dL    Comment: Performed at [facility] [address], [place], [place] [address]  Magnesium     Status: Abnormal   Collection Time: 12/25/17  4:36 AM  Result Value Ref Range   Magnesium 4.5 (H) 1.7 - 2.4 mg/dL    Comment: Performed at [facility] [address], [place],  [address]  Glucose,  capillary     Status: None   Collection Time: 12/25/17  7:46 AM  Result Value Ref Range   Glucose-Capillary 76 70 - 99 mg/dL    Recent Results (from the past 240 hour(s))  Culture, blood (single)     Status: None (Preliminary result)   Collection Time: 12/23/17 10:35 AM  Result Value Ref Range Status   Specimen Description BLOOD BLOOD RIGHT HAND  Final   Special Requests   Final    BOTTLES DRAWN AEROBIC AND ANAEROBIC Blood Culture adequate volume   Culture   Final    NO GROWTH 1 DAY Performed at [facility], [place] [address], [place], [place] [address]    Report Status PENDING  Incomplete  Culture, blood (single)     Status: None (Preliminary result)   Collection Time: 12/23/17 10:43 AM  Result Value Ref Range Status   Specimen Description BLOOD BLOOD LEFT HAND  Final   Special Requests   Final     BOTTLES DRAWN AEROBIC AND ANAEROBIC Blood Culture results may not be optimal due to an inadequate volume of blood received in culture bottles   Culture   Final    NO GROWTH 1 DAY Performed at [facility], [place] [address], [place], [place] [address]    Report Status PENDING  Incomplete  Culture, respiratory (non-expectorated)     Status: None (Preliminary result)   Collection Time: 12/23/17  4:36 PM  Result Value Ref Range Status   Specimen Description TRACHEAL ASPIRATE  Final   Special Requests NONE  Final   Gram Stain   Final    FEW WBC PRESENT, PREDOMINANTLY PMN RARE GRAM POSITIVE COCCI IN PAIRS    Culture   Final    CULTURE REINCUBATED FOR BETTER GROWTH Performed at [facility], [place] [address] [place], [place] [address]    Report Status PENDING  Incomplete  MRSA PCR Screening     Status: None   Collection Time: 12/24/17  4:14 AM  Result Value Ref Range Status   MRSA by PCR NEGATIVE NEGATIVE Final    Comment:        The GeneXpert MRSA Assay (FDA approved for NASAL specimens only), is one component of a comprehensive MRSA colonization surveillance program. It is not intended to diagnose MRSA infection nor to guide or monitor treatment for MRSA infections. Performed at [facility], [place] [address], [place], [place] [address]     Lipid Panel Recent Labs    12/23/17 6720  TRIG 291*    Studies/Results: Ct Head Wo Contrast  Result Date: 12/23/2017 CLINICAL DATA:  C-section 12/22/2017. Cardiac arrest. Preeclampsia. EXAM: CT HEAD WITHOUT CONTRAST TECHNIQUE: Contiguous axial images were obtained from the base of the skull through the vertex without intravenous contrast. COMPARISON:  None. FINDINGS: Brain: No evidence of acute infarction, hemorrhage, hydrocephalus, extra-axial collection or mass lesion/mass effect. Negative for cerebral edema. Vascular: Negative for hyperdense vessel Skull: Negative Sinuses/Orbits: Mucosal edema paranasal sinuses with  air-fluid level in the sphenoid sinus. Patient is intubated. Negative orbit. Other: None IMPRESSION: Negative CT head Paranasal sinus disease.  Patient is intubated. Electronically Signed   By: Franchot Gallo M.D.   On: 12/23/2017 10:07   Ct Angio Chest Pe W Or Wo Contrast  Result Date: 12/23/2017 CLINICAL DATA:  PE suspected.  Chest pain. EXAM: CT ANGIOGRAPHY CHEST CT ABDOMEN AND PELVIS WITH CONTRAST TECHNIQUE: Multidetector CT imaging of the chest was performed using the standard protocol during bolus  administration of intravenous contrast. Multiplanar CT image reconstructions and MIPs were obtained to evaluate the vascular anatomy. Multidetector CT imaging of the abdomen and pelvis was performed using the standard protocol during bolus administration of intravenous contrast. CONTRAST:  135m ISOVUE-370 IOPAMIDOL (ISOVUE-370) INJECTION 76% COMPARISON:  None. FINDINGS: CTA CHEST FINDINGS Cardiovascular: Normal heart size. No pericardial effusion. Satisfactory opacification of the pulmonary arteries but limited by pervasive motion artifact. No detected pulmonary embolism, with sensitivity significantly limited beyond the segmental levels. Limited opacification of the systemic arterial tree without abnormality. Mediastinum/Nodes: No noted adenopathy, hematoma, or pneumomediastinum. Lungs/Pleura: Endotracheal tube in good position. The central airways are clear when allowing for motion. There is patchy airspace opacity throughout all lobes, somewhat peribronchovascular apically. Dense consolidation in the medial lower lobes with some volume loss. No effusion, Kerley lines, or pneumothorax. Musculoskeletal: No detected fracture, limited by motion. Review of the MIP images confirms the above findings. CT ABDOMEN and PELVIS FINDINGS Hepatobiliary: Subcapsular lipoma along the right liver.No evidence of biliary obstruction or stone. Pancreas: Unremarkable. Spleen: Unremarkable. Adrenals/Urinary Tract: Negative  adrenals. Punctate left lower pole calculus. Presumed small cyst in the left interpolar kidney. Decompressed bladder by a Foley catheter. Stomach/Bowel: Negative. No evidence of ischemia or obstruction. No inflammatory changes Vascular/Lymphatic: No acute vascular abnormality. No mass or adenopathy. Reproductive:Recently gravid uterus with expected changes of cesarean section. Tubal ligation clips. Other: No ascites or pneumoperitoneum. Musculoskeletal: Subcutaneous lipoma in the left paramedian lumbar back. No acute finding Review of the MIP images confirms the above findings. IMPRESSION: 1. Extensive bilateral airspace disease, history favoring aspiration or noncardiogenic edema over pneumonia. 2. No evidence of pulmonary embolism. Motion is a significantly limiting factor beyond the segmental levels. 3. Expected findings of recent cesarean section. No acute finding in the abdomen. 4. History of recent CPR. No visible fracture as permitted by motion. Negative for air leak. Electronically Signed   By: JMonte FantasiaM.D.   On: 12/23/2017 10:17   Mr Brain Wo Contrast  Result Date: 12/23/2017 CLINICAL DATA:  Post cardiac arrest after C-section. Preeclampsia. High blood pressures. Possible aspiration. EXAM: MRI HEAD WITHOUT CONTRAST MRV HEAD WITHOUT CONTRAST TECHNIQUE: Multiplanar, multiecho pulse sequences of the brain and surrounding structures were obtained without intravenous contrast. Angiographic images of the intracranial venous structures were obtained using MRV technique without intravenous contrast. COMPARISON:  CT head 12/23/2017. FINDINGS: MRI BRAIN: Brain: No acute stroke, visible hemorrhage, mass lesion, hydrocephalus, or extra-axial fluid. No signs of anoxic injury, or hypoxic/ischemic insult. Normal for age cerebral volume. No white matter disease. No signs of preeclampsia/eclampsia/PRES. Vessels: Flow voids are maintained. No abnormality on gradient sequence. Skull and upper cervical region:  Diffuse low signal on T1 weighted images, nonspecific. Sinuses and orbits. No orbital findings. 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 Some layering fluid in the sphenoid sinus, likely related to recumbency. MRV: The superior sagittal sinus, internal cerebral veins, vein of Galen, and straight sinuses are all widely patent. No visible cortical venous thrombosis. Dominant RIGHT transverse and sigmoid sinus pattern, with both sides patent, and subsequent visualization of the BILATERAL patent and dominant RIGHT IJ. No signs of nonocclusive intrasinus thrombus on routine brain MR or axial source images. IMPRESSION: Noncontrast MRI of the brain is within normal limits. No evidence for anoxia or hypoxic/ischemic insult. No imaging signs of preeclampsia/eclampsia/PRES. MRV of the intracranial circulation demonstrates no evidence for sinovenous occlusive disease. Electronically Signed   By: JStaci RighterM.D.   On: 12/23/2017 19:48  Ct Abdomen Pelvis W Contrast  Result Date: 12/23/2017 CLINICAL DATA:  PE suspected.  Chest pain. EXAM: CT ANGIOGRAPHY CHEST CT ABDOMEN AND PELVIS WITH CONTRAST TECHNIQUE: Multidetector CT imaging of the chest was performed using the standard protocol during bolus administration of intravenous contrast. Multiplanar CT image reconstructions and MIPs were obtained to evaluate the vascular anatomy. Multidetector CT imaging of the abdomen and pelvis was performed using the standard protocol during bolus administration of intravenous contrast. CONTRAST:  152m ISOVUE-370 IOPAMIDOL (ISOVUE-370) INJECTION 76% COMPARISON:  None. FINDINGS: CTA CHEST FINDINGS Cardiovascular: Normal heart size. No pericardial effusion. Satisfactory opacification of the pulmonary arteries but limited by pervasive motion artifact. No detected pulmonary embolism, with sensitivity significantly limited beyond the segmental levels. Limited opacification of the systemic arterial  tree without abnormality. Mediastinum/Nodes: No noted adenopathy, hematoma, or pneumomediastinum. Lungs/Pleura: Endotracheal tube in good position. The central airways are clear when allowing for motion. There is patchy airspace opacity throughout all lobes, somewhat peribronchovascular apically. Dense consolidation in the medial lower lobes with some volume loss. No effusion, Kerley lines, or pneumothorax. Musculoskeletal: No detected fracture, limited by motion. Review of the MIP images confirms the above findings. CT ABDOMEN and PELVIS FINDINGS Hepatobiliary: Subcapsular lipoma along the right liver.No evidence of biliary obstruction or stone. Pancreas: Unremarkable. Spleen: Unremarkable. Adrenals/Urinary Tract: Negative adrenals. Punctate left lower pole calculus. Presumed small cyst in the left interpolar kidney. Decompressed bladder by a Foley catheter. Stomach/Bowel: Negative. No evidence of ischemia or obstruction. No inflammatory changes Vascular/Lymphatic: No acute vascular abnormality. No mass or adenopathy. Reproductive:Recently gravid uterus with expected changes of cesarean section. Tubal ligation clips. Other: No ascites or pneumoperitoneum. Musculoskeletal: Subcutaneous lipoma in the left paramedian lumbar back. No acute finding Review of the MIP images confirms the above findings. IMPRESSION: 1. Extensive bilateral airspace disease, history favoring aspiration or noncardiogenic edema over pneumonia. 2. No evidence of pulmonary embolism. Motion is a significantly limiting factor beyond the segmental levels. 3. Expected findings of recent cesarean section. No acute finding in the abdomen. 4. History of recent CPR. No visible fracture as permitted by motion. Negative for air leak. Electronically Signed   By: JMonte FantasiaM.D.   On: 12/23/2017 10:17   Dg Chest Port 1 View  Result Date: 12/25/2017 CLINICAL DATA:  Acute respiratory failure EXAM: PORTABLE CHEST 1 VIEW COMPARISON:  12/24/2017  FINDINGS: Cardiac shadow is mildly enlarged but stable. Left jugular central line, endotracheal tube and nasogastric catheter are again seen and stable. Vascular congestion is noted with increasing bibasilar infiltrative change when compare with the prior study. IMPRESSION: Vascular congestion and increasing bilateral infiltrates. Tubes and lines as described. Electronically Signed   By: MInez CatalinaM.D.   On: 12/25/2017 07:53   Dg Chest Port 1 View  Result Date: 12/24/2017 CLINICAL DATA:  Respiratory failure EXAM: PORTABLE CHEST 1 VIEW COMPARISON:  12/23/2017 FINDINGS: Cardiac shadow is stable. Endotracheal tube, nasogastric catheter and left jugular catheter are again seen. Bilateral infiltrates are again noted although slightly improved when compared with the prior study. No sizable effusion is seen. No bony abnormality is noted. IMPRESSION: Slight improvement in bilateral infiltrates when compared with the prior exam. Electronically Signed   By: MInez CatalinaM.D.   On: 12/24/2017 07:56   Dg Chest Port 1v Same Day  Result Date: 12/23/2017 CLINICAL DATA:  Check central line placement EXAM: PORTABLE CHEST 1 VIEW COMPARISON:  Film from earlier in the same day. FINDINGS: Endotracheal tube is again identified and stable. Nasogastric catheter has  been removed. Left jugular central line is noted in the proximal superior vena cava. No pneumothorax is seen. Diffuse bilateral infiltrates are identified and stable. No bony abnormality is noted. IMPRESSION: Tubes and lines as described above. Stable bilateral infiltrates. Electronically Signed   By: Inez Catalina M.D.   On: 12/23/2017 13:36   Dg Abd Portable 1v  Result Date: 12/24/2017 CLINICAL DATA:  NG tube placement EXAM: PORTABLE ABDOMEN - 1 VIEW COMPARISON:  12/23/2017 FINDINGS: NG tube tip is positioned in the mid stomach. Basilar airspace disease noted in the lungs bilaterally. Bowel gas pattern is nonspecific. IMPRESSION: NG tube tip is in the mid stomach.  Electronically Signed   By: Misty Stanley M.D.   On: 12/24/2017 01:34   Dg Abd Portable 1v  Result Date: 12/23/2017 CLINICAL DATA:  Check gastric catheter placement EXAM: PORTABLE ABDOMEN - 1 VIEW COMPARISON:  Films from earlier in the same day. FINDINGS: Previously seen nasogastric catheter is no longer identified. Scattered large and small bowel gas is noted. Contrast material is noted within the renal collecting systems consistent with the recent CT. IMPRESSION: No gastric catheter is identified. Clinical correlation is recommended. Electronically Signed   By: Inez Catalina M.D.   On: 12/23/2017 13:37   Mr Mrv Head Wo Cm  Result Date: 12/23/2017 CLINICAL DATA:  Post cardiac arrest after C-section. Preeclampsia. High blood pressures. Possible aspiration. EXAM: MRI HEAD WITHOUT CONTRAST MRV HEAD WITHOUT CONTRAST TECHNIQUE: Multiplanar, multiecho pulse sequences of the brain and surrounding structures were obtained without intravenous contrast. Angiographic images of the intracranial venous structures were obtained using MRV technique without intravenous contrast. COMPARISON:  CT head 12/23/2017. FINDINGS: MRI BRAIN: Brain: No acute stroke, visible hemorrhage, mass lesion, hydrocephalus, or extra-axial fluid. No signs of anoxic injury, or hypoxic/ischemic insult. Normal for age cerebral volume. No white matter disease. No signs of preeclampsia/eclampsia/PRES. Vessels: Flow voids are maintained. No abnormality on gradient sequence. Skull and upper cervical region: Diffuse low signal on T1 weighted images, nonspecific. Sinuses and orbits. No orbital findings. 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 Some layering fluid in the sphenoid sinus, likely related to recumbency. MRV: The superior sagittal sinus, internal cerebral veins, vein of Galen, and straight sinuses are all widely patent. No visible cortical venous thrombosis. Dominant RIGHT transverse and sigmoid sinus  pattern, with both sides patent, and subsequent visualization of the BILATERAL patent and dominant RIGHT IJ. No signs of nonocclusive intrasinus thrombus on routine brain MR or axial source images. IMPRESSION: Noncontrast MRI of the brain is within normal limits. No evidence for anoxia or hypoxic/ischemic insult. No imaging signs of preeclampsia/eclampsia/PRES. MRV of the intracranial circulation demonstrates no evidence for sinovenous occlusive disease. Electronically Signed   By: Staci Righter M.D.   On: 12/23/2017 19:48    Medications:  Scheduled: . albuterol  2.5 mg Nebulization Q6H  . chlorhexidine gluconate (MEDLINE KIT)  15 mL Mouth Rinse BID  . Chlorhexidine Gluconate Cloth  6 each Topical Daily  . feeding supplement (PRO-STAT SUGAR FREE 64)  30 mL Per Tube Daily  . feeding supplement (PRO-STAT SUGAR FREE 64)  60 mL Per Tube BID  . fentaNYL (SUBLIMAZE) injection  100 mcg Intravenous Once  . heparin injection (subcutaneous)  5,000 Units Subcutaneous Q8H  . measles, mumps and rubella vaccine  0.5 mL Subcutaneous Once  . mouth rinse  15 mL Mouth Rinse 10 times per day  . prenatal multivitamin  1 tablet Oral Q1200  . senna-docusate  2 tablet Oral Q24H  . sodium chloride flush  10-40 mL Intracatheter Q12H  . Tdap  0.5 mL Intramuscular Once   Continuous: . sodium chloride 10 mL/hr at 12/25/17 0800  . sodium chloride    . aztreonam Stopped (12/25/17 0548)  . famotidine (PEPCID) IV Stopped (12/24/17 2138)  . fentaNYL infusion INTRAVENOUS 150 mcg/hr (12/25/17 0800)  . levETIRAcetam Stopped (12/25/17 0252)  . magnesium sulfate 2 g/hr (12/25/17 0803)  . midazolam (VERSED) infusion 20 mg/hr (12/25/17 0800)  . phenylephrine (NEO-SYNEPHRINE) Adult infusion 130 mcg/min (12/25/17 0800)  . propofol (DIPRIVAN) infusion 70 mcg/kg/min (12/25/17 0811)  . valproate sodium Stopped (12/25/17 0653)  . vancomycin Stopped (12/25/17 0221)    LTM EEG 12/24/2017 at 7 20 am to 12/25/2017 at 7:20 AM: Clinical  interpretation: This day 2  intensive EEG monitoring with simultaneous video monitoring is abnormal due to nonreactive background activity slowing suggestive of severe encephalopathy of nonspecific etiologies.  Sedation status potentially can contribute to these findings.  Sharp contoured discharges most likely due to artifact. No clinical or subclinical seizures present.  Impression:41 y.o.femalewithPM & GYN hxG3 P-1-0-1-1, HSV, Endometriosis, Asthma, presented on 12/22/17 after presenting to standard prenatal GYN appointmentat 65 weekswith onset of elevated blood pressure and later admitted for severe pre-clampsia with SBP's in the 200'snecessitating c-section. Patient coded 1 day post of delivery. Follow up exams revealed unresponsive withmyoclonus. Myoclonic discharges no longer present on EEG with sedation. EEG background activities activities are suggestive of severe encephalopathy.  1. Continuous EEG day 2 now reveals sharp contoured discharges on nonreactive background slowing most likely due to artifact. The unreactive background is consistent with severe encephalopathy, medication-induced versus due to anoxic brain injury. No electrographic seizures were noted.  2. Overall clinical findings and imaging at presentation, taken together with acute hypertensive crisis, may be secondary to hypertensive encephalopathy secondary to eclampsia versus diffuse anoxic brain injury secondary to code.  3. MRI of the brain is within normal limits. No evidence for anoxia or hypoxic/ischemic insult and no imaging evidence for PRES. 4. 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 This has resolved on the EEG.  5. Prognosis may be better than initially thought given artifact instead of myoclonic discharges on EEG. Discussed with Dr. Lake Bells.   Recommendations: --Hold propofol gtt and call neurology to re-examine in 1 hour. If the patient exhibits no recrudescence of myoclonus  would continue to hold propofol.   --Continue Keppra 1000 mg IV BID - Goal to maintain adequate BP and MAP >80 for brain perfusion- discussed with Intensivist-  --Continue Versed infusion --Will need to fully wean off both propofol and midazolam for 24 hours prior to making a prognosis for possible neurological recovery. If recrudescence of myoclonus makes this impossible, then discussion with family as to goals of care would be warranted.  --EEG tech has been called to adjust the EEG components to eliminate artifact.   35 minutes of time spent in the neurological evaluation and management of this critically ill patient. Time spent included EEG review and coordination of care.    LOS: 3 days   @Electronically  signed: Dr. Kerney Elbe 12/25/2017  9:01 AM

## 2017-12-25 NOTE — Procedures (Signed)
Electroencephalography report.  Long-term monitoring  Recording begins 12/24/2017 at 7 20 am  Recording ends 12/25/2017 at 7:20 AM  Day 2 Cpt 95951   Date acquisition: International 10-20 4 electrode placement.  18 channels EEG with additional EKG channel  This intensive EEG monitoring with simultaneous video monitoring was performed for this patient with myoclonic jerks and status post cardiac arrest to rule out clinical and subclinical electrographic seizures.  Patient is intubated.  Sedation status is unavailable  Day 1 Background activities marked by attenuated broad delta slowing with superimposed faster frequencies in the beta range.  Near continuous electrode artifact present broadly throughout the recording.  As recording begins patient have frequent myoclonic jerks which subsided.  Through the rest of the recording background activities marked by attenuated delta slowing with occasionally a low amplitude broad sharp waves suggestive of cortical irritability.  Day 2: No clinical or subclinical seizures present throughout the recording.  Background activities marked by nonreactive attenuated background activities delta slowing.  Superimposed paracentral dominant sharp waves most likely due to artifact.  Interictal spike and wave discharges less likely due to spikes morphology and distribution.  Impedances were not checked or identified during this recording.  Recommended impedances < 5 KOhms  and replacement of   ground and reference electrodes as well as check appropriateness of electrode placement to make sure any artifacts were eliminated.   Clinical interpretation: This day 2  intensive EEG monitoring with simultaneous video monitoring is abnormal due to nonreactive background activity slowing suggestive of severe encephalopathy of nonspecific etiologies.  Sedation status potentially can contribute to these findings.  Sharp contoured discharges most likely due to artifact.  Please see  above discussion.  Recommendations as above to make sure all artifact eliminated to better ascertain whether underlying interictal epileptiform discharges are present. No clinical or subclinical seizures present.

## 2017-12-25 NOTE — Progress Notes (Signed)
PULMONARY / CRITICAL CARE MEDICINE   Name: Laura GrebeKimberly Valentine MRN: [id number] DOB: 07/19/1975    ADMISSION DATE:  12/22/2017 CONSULTATION DATE:  12/23/2017  REFERRING MD:  Henderson CloudHorvath  CHIEF COMPLAINT:  Cardiac arrest  HISTORY OF PRESENT ILLNESS:   42 y/o female with asthma [facility] G[facility] P101[facility] in the setting of rising blood pressures.  She underwent a C-section that evening and received blood pressure medications and magnesium sulfate.  By [id number] on 8/2 she had a cardiac arrest.  She described trouble breathing prior and was then found unresponsive and pulseless.  CPR was performed and the patient received a shock during the code.  [facility]    PAST MEDICAL HISTORY :  She  has a past medical history of Endometriosis, GERD (gastroesophageal reflux disease), HSV (herpes simplex virus) anogenital infection, and Infection.   SUBJECTIVE:  No acute events   VITAL SIGNS: BP 134/66   Pulse 70   Temp 98.8 F (37.1 C)   Resp 16   Ht 5\' 3"  (1.6 m)   Wt 115.1 kg (253 lb 12 oz)   LMP 03/29/2017   SpO2 99%   Breastfeeding? Unknown   BMI 44.95 kg/m   HEMODYNAMICS: CVP:  [9 mmHg-18 mmHg] 12 mmHg  VENTILATOR SETTINGS: Vent Mode: PRVC FiO2 (%):  [40 %] 40 % Set Rate:  [16 bmp] 16 bmp Vt Set:  [520 mL] 520 mL PEEP:  [5 cmH20] 5 cmH20 Plateau Pressure:  [18 cmH20-20 cmH20] 20 cmH20  INTAKE / OUTPUT: I/O last 3 completed shifts: In: 13539.5 [I.V.:7545.6; NG/GT:235; IV Piggyback:5758.9] Out: 4950 [Urine:4950]  PHYSICAL EXAMINATION:  General:  In bed on vent HENT: NCAT ETT in place PULM: CTA B, vent supported breathing CV: RRR, no mgr GI: BS+, soft, nontender MSK: normal bulk and tone Neuro: sedated on vent   LABS:  BMET Recent Labs  Lab 12/24/17 0509 12/24/17 1826 12/25/17 0436  NA 137 139 141  K 4.8 4.7 5.0  CL 105 110 110  CO2 25 24 26   BUN 12 8 5*  CREATININE 0.71 0.69 0.56   GLUCOSE 106* 102* 79    Electrolytes Recent Labs  Lab 12/24/17 0509 12/24/17 0911 12/24/17 1826 12/24/17 2149 12/25/17 0436  CALCIUM 7.2*  --  7.1*  --  7.2*  MG  --  5.0*  --  4.6* 4.5*  PHOS 4.4 4.5 4.3  --  4.5    CBC Recent Labs  Lab 12/24/17 0509 12/24/17 1826 12/25/17 0436  WBC 12.5* 10.6* 8.6  HGB 8.6* 7.8* 7.5*  HCT 26.9* 25.9* 25.0*  PLT 224 237 212    Coag's No results for input(s): APTT, INR in the last 168 hours.  Sepsis Markers Recent Labs  Lab 12/23/17 0903 12/23/17 1407  LATICACIDVEN 2.3* 1.7    ABG Recent Labs  Lab 12/23/17 0650 12/23/17 0829 12/23/17 1016  PHART 7.245* 7.328* 7.355  PCO2ART 44.7 44.1 45.6  PO2ART 139* 54.0* 212.0*    Liver Enzymes Recent Labs  Lab 12/23/17 0903 12/23/17 2242 12/25/17 0436  AST 77* 62* 42*  ALT 55* 39 26  ALKPHOS 145* 112 94  BILITOT 0.5 0.6 0.5  ALBUMIN 2.1* 2.0* 1.7*    Cardiac Enzymes Recent Labs  Lab 12/23/17 0600 12/23/17 0903  TROPONINI 0.03* 0.08*    Glucose Recent Labs  Lab 12/24/17 1200 12/24/17 1609 12/24/17 2002 12/24/17 2341 12/25/17 0345 12/25/17 0746  GLUCAP 89 85 80 91  73 76    Imaging Dg Chest Port 1 View  Result Date: 12/25/2017 CLINICAL DATA:  Acute respiratory failure EXAM: PORTABLE CHEST 1 VIEW COMPARISON:  12/24/2017 FINDINGS: Cardiac shadow is mildly enlarged but stable. Left jugular central line, endotracheal tube and nasogastric catheter are again seen and stable. Vascular congestion is noted with increasing bibasilar infiltrative change when compare with the prior study. IMPRESSION: Vascular congestion and increasing bilateral infiltrates. Tubes and lines as described. Electronically Signed   By: Alcide Clever M.D.   On: 12/25/2017 07:53     STUDIES:  8/2 EEG > burst suppression pattern 8/2 CT angiogram chest> no pe, extensive bilateral airspace disease either due to aspiration or pulmonary edema 8/2 CT abdomen> expected findings post c-section 8/2  CT head > negative, some paranasal sinus disease 8/2 non contrast MRI brain within normal limits, no evidence of anoxia, no findings of PRES, MRV showed no sinovenous occlusive disease 8/2 Echo> LVEF normal  CULTURES: 8/2 resp culture GPC in pairs 8/2 blood >   ANTIBIOTICS: 8/2 vanc >  8/2 cefepime> 8/3 8/2 flagyl > 8/3 8/3 aztreonam >  SIGNIFICANT EVENTS: 8/1 elective C-section in setting of hypertension 38 weeks, seizure 8/2 cardiac arrest  LINES/TUBES: 8/2 ETT 8/2 L IJ CVL   DISCUSSION: 42 y/o female with cardiac arrest after elective c-section for pre-eclampsia.  Given hypertension, frothy edema from ETT, bilateral infiltrates I think that acute pulmonary edema is most likely vs aspiration.  Ongoing abnormal EEG activity being treated as eclampsia with burst suppression and magnesium. Concern for anoxic brain injury remains.   ASSESSMENT / PLAN:  PULMONARY A: Acute respiratory failure with hypoxemia Acute pulmonary edema Possible aspiration pneumonia P:   Full mechanical vent support VAP prevention Daily WUA/SBT Evaluate for extubation as mental status allows  CARDIOVASCULAR A:  Hypertension > improved Hypotension in setting of high sedation, UOP adequate, no evidence of end organ damage P:  Tele CVP goal 9-12 Goal MAP after cardiac arrest is > 80 Monitor UOP closely  RENAL A:   No acute issues P:   Monitor BMET and UOP Replace electrolytes as needed   GASTROINTESTINAL A:   Mild shock liver > nearly resolved P:   Continue tube feeding Continue medical stress ulcer prophylaxis    HEMATOLOGIC A:   Dropping hemoglobin without evidnece of bleeding, suspect dilutional? No evidence of DIC P:  Monitor for bleeding Transfuse PRBC for Hgb < 7 gm/dL F/u haptoglobin  INFECTIOUS A:   Aspiration pneumonia?  P:   Stop vanc Continue aztreonam for now Monitor cultures  ENDOCRINE A:   No acute issues P:   Monitor glucose  NEUROLOGIC A:    Seizures eclampsia Concern for hypertensive encephalopathy Concern for anoxic brain injury P:   Stop magnesium drip > OK per OB Continue versed and propofol for burst suppresion keppra per neuro continueous eeg monitoring to continue Stop sedation after no seizures activity for 24 hours Assess neuro status again when off of sedation  Neuro normothermia protocol phase 1  FAMILY  - Updates: will update family bedside  - Inter-disciplinary family meet or Palliative Care meeting due by:  Day 7   My cc time 40 minutes  Heber Lonsdale, MD [facility] Pager: [phone] Cell: [phone] After 3pm or if no response, call [phone]   12/25/2017, 9:12 AM

## 2017-12-25 NOTE — Progress Notes (Signed)
Wasted 250 mcg of fentanyl in sink with Designer, fashion/clothingheleigh Fields RN.

## 2017-12-25 NOTE — Progress Notes (Signed)
Chaplain responded to request of Nursing Unit to family of patient who has received Dr. report that the patient's medical status is critical in nature. Chaplain presented to the patient's room where the patient's husband, mother, were gathered, and offered ministry of presence, a prayer of comfort and peace on their behalf.Chaplain will continue to follow for support, and referral to on call Chaplain to follow up as needed. Chaplain Janell QuietAudrey Sweet Jarvis [phone]

## 2017-12-25 NOTE — Progress Notes (Signed)
Pt breasts pumped x 15 minutes with hand expression done for few drops of colostrum collected and placed in cooler, pt tolerated well. Completed at 1930, 2300, 0245, and 0600.

## 2017-12-25 NOTE — Progress Notes (Addendum)
Now off Versed as well as propofol. Versed off for 1.5 hours. Patient without responses to any stimuli with GCS of 3. EEG with worsened generalized myoclonic discharges, consistent with myoclonus status.   A/R: 42 year old female with history and constellation of exam and EEG findings most consistent with anoxic brain injury and myoclonus status.  1. Unable to resolve myoclonic EEG discharges despite high dose Versed and propofol while on two anticonvulsants, VPA and Keppra.  2. Not clinically with myoclonic activity, therefore benefits of holding further Versed and propofol for an optimal prognostic neurological exam tomorrow outweighs the risks.  3. Continue LTM EEG, VPA and Keppra  4. Discussed increasing likelihood of a dismal prognosis with the family. 5. Updated CCM team.  15 minutes of ICU time.   Electronically signed: Dr. Caryl PinaEric Vi Whitesel

## 2017-12-25 NOTE — Progress Notes (Signed)
Husband expressed wishes to discontinue breast pumping attempts; baby's check up with pediatrician went well today and approved appropriate formula dose for baby. Pt has not produced any milk today. Will continue to monitor for fullness or signs of clogging.

## 2017-12-25 NOTE — Progress Notes (Signed)
EEG LTM maint complete. No skin breakdown. Continue to monitor 

## 2017-12-25 NOTE — Progress Notes (Signed)
Pt intubated and sedated. Discussed status with patient's ICU RN prior to team seeing patient this morning.  Advised with normal and mild BPs and 48 hours of magnesium sulfate, ok to discontinue MgSO4.  RN passed along to team. Abd soft, and fundus appropriately low, incision intact and appears to be healing well, no erythema or drainage, no vaginal bleeding apparent on peripad. Spoke with patient's husband and reviewed potential support services for him.  [facility] and doing well at home with help of family. Appreciate the work of the interdisciplinary team.  Please contact me with any obstetric/post op questions.

## 2017-12-25 NOTE — Progress Notes (Signed)
   Notes reviewed. No further cardiac recs.  Please let us know if we can be of further assistance. Signing off.  Donato SchultzMark Kooper Chriswell, MD

## 2017-12-25 NOTE — Progress Notes (Signed)
Attempted breast pump to bilateral breasts x 15 minutes with hand expression and warm compresses without successful excretion.

## 2017-12-25 NOTE — Progress Notes (Signed)
Pt having jerking full body movements, especially post stimulation.  Dr. Wilford CornerArora made aware and has confirmed myoclonic movement only via EEG and video.  Will continue to monitor.

## 2017-12-26 ENCOUNTER — Encounter (HOSPITAL_COMMUNITY)
Admission: RE | Admit: 2017-12-26 | Discharge: 2017-12-26 | Disposition: A | Discharge status: Home / Self Care | Payer: Managed Care, Other (non HMO) | Source: Ambulatory Visit | Attending: Obstetrics and Gynecology | Admitting: Obstetrics and Gynecology

## 2017-12-26 ENCOUNTER — Inpatient Hospital Stay (HOSPITAL_COMMUNITY): Payer: Managed Care, Other (non HMO)

## 2017-12-26 DIAGNOSIS — G931 Anoxic brain damage, not elsewhere classified: Secondary | ICD-10-CM

## 2017-12-26 HISTORY — DX: Anogenital herpesviral infection, unspecified: A60.9

## 2017-12-26 LAB — POCT I-STAT 3, ART BLOOD GAS (G3+)
Acid-base deficit: 2 mmol/L (ref 0.0–2.0)
Bicarbonate: 23 mmol/L (ref 20.0–28.0)
O2 Saturation: 99 %
PCO2 ART: 41.3 mmHg (ref 32.0–48.0)
PH ART: 7.354 (ref 7.350–7.450)
TCO2: 24 mmol/L (ref 22–32)
pO2, Arterial: 129 mmHg — ABNORMAL HIGH (ref 83.0–108.0)

## 2017-12-26 LAB — PHOSPHORUS
Phosphorus: 4.3 mg/dL (ref 2.5–4.6)
Phosphorus: 4.7 mg/dL — ABNORMAL HIGH (ref 2.5–4.6)

## 2017-12-26 LAB — CBC WITH DIFFERENTIAL/PLATELET
Abs Immature Granulocytes: 0 10*3/uL (ref 0.0–0.1)
Basophils Absolute: 0 10*3/uL (ref 0.0–0.1)
Basophils Relative: 0 %
EOS PCT: 2 %
Eosinophils Absolute: 0.1 10*3/uL (ref 0.0–0.7)
HEMATOCRIT: 24.9 % — AB (ref 36.0–46.0)
HEMOGLOBIN: 7.5 g/dL — AB (ref 12.0–15.0)
Immature Granulocytes: 0 %
LYMPHS ABS: 1 10*3/uL (ref 0.7–4.0)
Lymphocytes Relative: 16 %
MCH: 25.2 pg — ABNORMAL LOW (ref 26.0–34.0)
MCHC: 30.1 g/dL (ref 30.0–36.0)
MCV: 83.6 fL (ref 78.0–100.0)
Monocytes Absolute: 0.4 10*3/uL (ref 0.1–1.0)
Monocytes Relative: 6 %
Neutro Abs: 4.7 10*3/uL (ref 1.7–7.7)
Neutrophils Relative %: 76 %
Platelets: 199 10*3/uL (ref 150–400)
RBC: 2.98 MIL/uL — AB (ref 3.87–5.11)
RDW: 15.8 % — ABNORMAL HIGH (ref 11.5–15.5)
WBC: 6.2 10*3/uL (ref 4.0–10.5)

## 2017-12-26 LAB — GLUCOSE, CAPILLARY
GLUCOSE-CAPILLARY: 102 mg/dL — AB (ref 70–99)
GLUCOSE-CAPILLARY: 69 mg/dL — AB (ref 70–99)
GLUCOSE-CAPILLARY: 69 mg/dL — AB (ref 70–99)
GLUCOSE-CAPILLARY: 91 mg/dL (ref 70–99)
Glucose-Capillary: 72 mg/dL (ref 70–99)
Glucose-Capillary: 87 mg/dL (ref 70–99)

## 2017-12-26 LAB — VALPROIC ACID LEVEL: VALPROIC ACID LVL: 20 ug/mL — AB (ref 50.0–100.0)

## 2017-12-26 LAB — MAGNESIUM
MAGNESIUM: 1.8 mg/dL (ref 1.7–2.4)
Magnesium: 1.8 mg/dL (ref 1.7–2.4)

## 2017-12-26 LAB — TRIGLYCERIDES: Triglycerides: 310 mg/dL — ABNORMAL HIGH (ref <150)

## 2017-12-26 MED ORDER — PRO-STAT SUGAR FREE PO LIQD
30.0000 mL | Freq: Every day | ORAL | Status: DC
  Administered 2017-12-27 – 2017-12-29 (×3): 30 mL
  Filled 2017-12-26 (×3): qty 30

## 2017-12-26 MED ORDER — MAGNESIUM SULFATE 2 GM/50ML IV SOLN
2.0000 g | Freq: Once | INTRAVENOUS | Status: AC
  Administered 2017-12-26: 2 g via INTRAVENOUS
  Filled 2017-12-26: qty 50

## 2017-12-26 MED ORDER — VALPROATE SODIUM 500 MG/5ML IV SOLN
1000.0000 mg | Freq: Once | INTRAVENOUS | Status: AC
  Administered 2017-12-26: 1000 mg via INTRAVENOUS
  Filled 2017-12-26: qty 10

## 2017-12-26 MED ORDER — CHLORHEXIDINE GLUCONATE 0.12 % MT SOLN
OROMUCOSAL | Status: AC
  Filled 2017-12-26: qty 15

## 2017-12-26 MED ORDER — SODIUM CHLORIDE 0.9 % IV SOLN
30.0000 mg/h | INTRAVENOUS | Status: DC
  Administered 2017-12-26: 10 mg/h via INTRAVENOUS
  Administered 2017-12-26: 20 mg/h via INTRAVENOUS
  Filled 2017-12-26 (×2): qty 20

## 2017-12-26 MED ORDER — VITAL AF 1.2 CAL PO LIQD
1500.0000 mL | ORAL | Status: DC
  Administered 2017-12-26: 1500 mL
  Filled 2017-12-26 (×3): qty 1500

## 2017-12-26 MED ORDER — ATROPINE SULFATE 1 MG/10ML IJ SOSY
PREFILLED_SYRINGE | INTRAMUSCULAR | Status: AC
  Filled 2017-12-26: qty 10

## 2017-12-26 MED ORDER — MIDAZOLAM BOLUS VIA INFUSION
10.0000 mg | INTRAVENOUS | Status: AC
  Administered 2017-12-26 (×3): 10 mg via INTRAVENOUS
  Filled 2017-12-26 (×3): qty 10

## 2017-12-26 MED ORDER — LEVETIRACETAM IN NACL 1500 MG/100ML IV SOLN
1500.0000 mg | Freq: Two times a day (BID) | INTRAVENOUS | Status: DC
  Administered 2017-12-27 – 2018-01-02 (×14): 1500 mg via INTRAVENOUS
  Filled 2017-12-26 (×15): qty 100

## 2017-12-26 MED ORDER — EPINEPHRINE PF 1 MG/10ML IJ SOSY
PREFILLED_SYRINGE | INTRAMUSCULAR | Status: AC
  Filled 2017-12-26: qty 20

## 2017-12-26 MED ORDER — FUROSEMIDE 10 MG/ML IJ SOLN
40.0000 mg | Freq: Three times a day (TID) | INTRAMUSCULAR | Status: AC
  Administered 2017-12-26 (×2): 40 mg via INTRAVENOUS
  Filled 2017-12-26 (×2): qty 4

## 2017-12-26 MED ORDER — SODIUM CHLORIDE 0.9 % IV SOLN
100.0000 mg/h | INTRAVENOUS | Status: DC
  Administered 2017-12-26: 10 mg/h via INTRAVENOUS
  Administered 2017-12-27: 70 mg/h via INTRAVENOUS
  Administered 2017-12-27 – 2017-12-28 (×4): 100 mg/h via INTRAVENOUS
  Filled 2017-12-26 (×6): qty 100
  Filled 2017-12-26: qty 50
  Filled 2017-12-26: qty 100
  Filled 2017-12-26: qty 50

## 2017-12-26 MED ORDER — SODIUM CHLORIDE 0.9 % IV SOLN
30.0000 mg/h | INTRAVENOUS | Status: DC
  Administered 2017-12-26 – 2017-12-27 (×2): 20 mg/h via INTRAVENOUS
  Filled 2017-12-26 (×2): qty 50

## 2017-12-26 MED ORDER — LORAZEPAM 2 MG/ML IJ SOLN
4.0000 mg | Freq: Once | INTRAMUSCULAR | Status: AC
  Administered 2017-12-26: 4 mg via INTRAVENOUS
  Filled 2017-12-26: qty 2

## 2017-12-26 MED ORDER — SODIUM CHLORIDE 0.9 % IV SOLN: 0.5000 mg/h | INTRAVENOUS | Status: DC

## 2017-12-26 MED ORDER — DEXTROSE 5 % IV SOLN
INTRAVENOUS | Status: DC
  Administered 2017-12-26: 11:00:00 via INTRAVENOUS
  Administered 2017-12-26 – 2017-12-27 (×2): 40 mL/h via INTRAVENOUS

## 2017-12-26 MED ORDER — SODIUM CHLORIDE 0.9 % IV SOLN
20.0000 mg/h | INTRAVENOUS | Status: DC
  Filled 2017-12-26: qty 50

## 2017-12-26 MED ORDER — VALPROATE SODIUM 500 MG/5ML IV SOLN
750.0000 mg | Freq: Three times a day (TID) | INTRAVENOUS | Status: DC
  Administered 2017-12-26 – 2017-12-27 (×2): 750 mg via INTRAVENOUS
  Filled 2017-12-26 (×3): qty 7.5

## 2017-12-26 NOTE — Progress Notes (Signed)
eLink Physician-Brief Progress Note Patient Name: Laura GrebeKimberly Valentine DOB: 09/30/1975 MRN: [id number]   Date of Service  12/26/2017  HPI/Events of Note  Hypoglycemia - Blood glucose has dropped twice into 60's to 70's in last 12 hours.   eICU Interventions  Will order: 1. D5W to run IV at 40 mL/hour.      Intervention Category Major Interventions: Other:  Lether Tesch Dennard Nipugene 12/26/2017, 4:52 AM

## 2017-12-26 NOTE — Progress Notes (Signed)
150 mL of IV Versed wasted in sink. Witnessed by Loni BeckwithAmal Aden, RN.

## 2017-12-26 NOTE — Progress Notes (Addendum)
Subjective: Continues to have myoclonus.   Exam: Vitals:   12/26/17 0730 12/26/17 0741  BP: 135/84   Pulse: 95 95  Resp: (!) 23 (!) 36  Temp: 99 F (37.2 C) 99 F (37.2 C)  SpO2: 99% 99%   Gen: In bed, intubated Resp: ventilated Abd: soft, nt  Neuro: MS: Does not open eyes or follow commands. With any stimulation, she has generalized myoclonic jerks.  ZO:XWRUECN:PERRL, corneals intact, +cough Motor: No motor response to noxious stimulation other than myoclonus.  Sensory:as above.   Pertinent Labs: Anemic 7.5  Impression: 42 yo F with anoxic brain injury s/p cardiac arrest. Her suppressed background with GPEDs and burst suppression initially are strongly suggestive of poor prognosis. She was treated with propofol initially with suppression of discharges, however with any lightening of sedation they returned.   Though initial MRI was negative, I think that this could be repeated at this point, as sensitivity for anoxia increases at this point.   She was off of sedation overnight, but I do not think that continued holding it would give us any more diagnostic information. I would favor suppressing movements.   At this point, her prognosis is very guarded.   Recommendations: 1) Repeat MRI brain  2) VPA level 3) continue depakote 500mg  TID 4) continue keppra 1000mg  BID  This patient is critically ill and at significant risk of neurological worsening, death and care requires constant monitoring of vital signs, hemodynamics,respiratory and cardiac monitoring, neurological assessment, discussion with family, other specialists and medical decision making of high complexity. I spent 45 minutes of neurocritical care time  in the care of  this patient.  Ritta SlotMcNeill Michaelpaul Apo, MD [facility] [phone]  If 7pm- 7am, please page neurology on call as listed in AMION. 12/26/2017  8:45 AM

## 2017-12-26 NOTE — Progress Notes (Signed)
Pt incision continues to heal well. Abd- soft, non distended , no masses.   Please contact me for any questions. [phone]

## 2017-12-26 NOTE — Procedures (Signed)
Electroencephalography report.  Long-term monitoring  Recording begins 12/25/2017 at 7 20 am  Recording ends 12/26/2017 at 7:20 AM  Day 3 Cpt 95951   Date acquisition: International 10-20 4 electrode placement.  18 channels EEG with additional EKG channel  This intensive EEG monitoring with simultaneous video monitoring was performed for this patient with myoclonic jerks and status post cardiac arrest to rule out clinical and subclinical electrographic seizures.  Patient is intubated.  Sedation was decreased during this recording.  Day 1 Background activities marked by attenuated broad delta slowing with superimposed faster frequencies in the beta range.  Near continuous electrode artifact present broadly throughout the recording.  As recording begins patient have frequent myoclonic jerks which subsided.  Through the rest of the recording background activities marked by attenuated delta slowing with occasionally a low amplitude broad sharp waves suggestive of cortical irritability.  Day 2: No clinical or subclinical seizures present throughout the recording.  Background activities marked by nonreactive attenuated background activities delta slowing.  Superimposed paracentral dominant sharp waves most likely due to artifact.  Interictal spike and wave discharges less likely due to spikes morphology and distribution.  Impedances were not checked or identified during this recording.  Recommended impedances < 5 KOhms  and replacement of   ground and reference electrodes as well as check appropriateness of electrode placement to make sure any artifacts were eliminated.  Day 3: During first several hours of recording background activities marked by attenuated nonreactive background activities with superimposed occasionally poorly formed generalized anterior dominant sharp waves without seizures. With  Sedation decreased,  background activities are evolving into more prominent frequent and eventually near  continuous generalized anterior dominant spike and polyspike and wave discharges.  Brief electrographic seizures also noted during the last few hours of the recording.   Clinical interpretation: This day 3 of  intensive EEG monitoring with simultaneous video monitoring marked by increased and now near continues generalized spike and polyspike and wave discharges and brief electrographic seizures and sedation was decreased around 1 PM.  These findings suggestive of severe encephalopathy and  significant cortical irritability.

## 2017-12-26 NOTE — Progress Notes (Signed)
80 mL of Versed wasted in the sink. Witnessed by Montine CircleKim Jenkins, RN.

## 2017-12-26 NOTE — Progress Notes (Signed)
MRI with no clear evidence of injury. EEG continues with frequent GPEDs, now with some evolution.  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   Given her young age, intact brainstem reflexes,  and relatively normal imaging, I would favor repeating burst suppression for another 48 hours with the goal of stopping these discharges. If they recur, I do not think that I would continue suppression indefinitely.   I discussed this with the family.   Ritta SlotMcNeill Leeland Lovelady, MD [facility] [phone]  If 7pm- 7am, please page neurology on call as listed in AMION.

## 2017-12-26 NOTE — Progress Notes (Signed)
Transported pt to MRI and back no noted respiratory issues at this time.

## 2017-12-26 NOTE — Progress Notes (Signed)
25cc of Versed wasted in sink with Nechama GuardJennifer Motley, RN.

## 2017-12-26 NOTE — Plan of Care (Signed)
  Problem: Education: Goal: Knowledge of General Education information will improve Description Including pain rating scale, medication(s)/side effects and non-pharmacologic comfort measures Outcome: Not Progressing   Problem: Health Behavior/Discharge Planning: Goal: Ability to manage health-related needs will improve Outcome: Not Progressing   Problem: Clinical Measurements: Goal: Ability to maintain clinical measurements within normal limits will improve Outcome: Not Progressing Goal: Will remain free from infection Outcome: Progressing Goal: Diagnostic test results will improve Outcome: Progressing Goal: Respiratory complications will improve Outcome: Progressing Goal: Cardiovascular complication will be avoided Outcome: Not Progressing   Problem: Activity: Goal: Risk for activity intolerance will decrease Outcome: Not Progressing   Problem: Nutrition: Goal: Adequate nutrition will be maintained Outcome: Not Progressing   Problem: Coping: Goal: Level of anxiety will decrease Outcome: Not Progressing   Problem: Elimination: Goal: Will not experience complications related to bowel motility Outcome: Not Progressing Goal: Will not experience complications related to urinary retention Outcome: Not Progressing   Problem: Pain Managment: Goal: General experience of comfort will improve Outcome: Not Progressing   Problem: Safety: Goal: Ability to remain free from injury will improve Outcome: Progressing   Problem: Skin Integrity: Goal: Risk for impaired skin integrity will decrease Outcome: Not Progressing   Problem: Activity: Goal: Ability to tolerate increased activity will improve Outcome: Not Progressing   Problem: Respiratory: Goal: Ability to maintain a clear airway and adequate ventilation will improve Outcome: Not Progressing   Problem: Role Relationship: Goal: Method of communication will improve Outcome: Not Progressing

## 2017-12-26 NOTE — Progress Notes (Signed)
vLTM EEG reconnected per Dr. Amada JupiterKirkpatrick. No skin breakdown. Continue to monitor

## 2017-12-26 NOTE — Progress Notes (Signed)
PULMONARY / CRITICAL CARE MEDICINE   Name: Laura Valentine MRN: [id number] DOB: 1975-07-13    ADMISSION DATE:  12/22/2017 CONSULTATION DATE:  12/23/2017  REFERRING MD:  Henderson Cloud  CHIEF COMPLAINT:  Cardiac arrest  HISTORY OF PRESENT ILLNESS:   42 y/o female with asthma admitted G3 P1011 at [redacted]w[redacted]d to [facility] hospital on 8/1 in the setting of rising blood pressures.  She underwent a C-section that evening and received blood pressure medications and magnesium sulfate.  By 4098 on 8/2 she had a cardiac arrest.  She described trouble breathing prior and was then found unresponsive and pulseless.  CPR was performed and the patient received a shock during the code.  She was transferred to [facility] for further evaluation.    PAST MEDICAL HISTORY :  She  has a past medical history of Endometriosis, GERD (gastroesophageal reflux disease), HSV (herpes simplex virus) anogenital infection, and Infection.   SUBJECTIVE:  MRI this am  Myoclonus jerking   VITAL SIGNS: BP 140/60   Pulse 91   Temp 99 F (37.2 C)   Resp 16   Ht 5\' 3"  (1.6 m)   Wt 253 lb 12 oz (115.1 kg)   LMP 03/29/2017   SpO2 98%   Breastfeeding? Unknown   BMI 44.95 kg/m   HEMODYNAMICS: CVP:  [4 mmHg-10 mmHg] 7 mmHg  VENTILATOR SETTINGS: Vent Mode: PRVC FiO2 (%):  [40 %] 40 % Set Rate:  [16 bmp] 16 bmp Vt Set:  [520 mL] 520 mL PEEP:  [5 cmH20] 5 cmH20 Plateau Pressure:  [13 cmH20-20 cmH20] 13 cmH20  INTAKE / OUTPUT: I/O last 3 completed shifts: In: 8909.8 [I.V.:3679.4; NG/GT:175; IV Piggyback:5055.5] Out: 4020 [Urine:4020]  PHYSICAL EXAMINATION:  General:  In bed on vent HENT: NCAT ETT in place PULM: CTA B, vent supported breathing CV: RRR, no mgr GI: BS+, soft, nontender MSK: normal bulk and tone Neuro: sedated on vent   LABS:  BMET Recent Labs  Lab 12/24/17 0509 12/24/17 1826 12/25/17 0436  NA 137 139 141  K 4.8 4.7 5.0  CL 105 110 110  CO2 25 24 26   BUN 12 8 5*  CREATININE 0.71  0.69 0.56  GLUCOSE 106* 102* 79    Electrolytes Recent Labs  Lab 12/24/17 0509  12/24/17 1826  12/25/17 0436 12/25/17 1606 12/26/17 0430  CALCIUM 7.2*  --  7.1*  --  7.2*  --   --   MG  --    < >  --    < > 4.5* 2.8* 1.8  PHOS 4.4   < > 4.3  --  4.5 3.8 4.3   < > = values in this interval not displayed.    CBC Recent Labs  Lab 12/24/17 1826 12/25/17 0436 12/26/17 0430  WBC 10.6* 8.6 6.2  HGB 7.8* 7.5* 7.5*  HCT 25.9* 25.0* 24.9*  PLT 237 212 199    Coag's No results for input(s): APTT, INR in the last 168 hours.  Sepsis Markers Recent Labs  Lab 12/23/17 0903 12/23/17 1407  LATICACIDVEN 2.3* 1.7    ABG Recent Labs  Lab 12/23/17 0829 12/23/17 1016 12/24/17 0401  PHART 7.328* 7.355 7.354  PCO2ART 44.1 45.6 41.3  PO2ART 54.0* 212.0* 129.0*    Liver Enzymes Recent Labs  Lab 12/23/17 0903 12/23/17 2242 12/25/17 0436  AST 77* 62* 42*  ALT 55* 39 26  ALKPHOS 145* 112 94  BILITOT 0.5 0.6 0.5  ALBUMIN 2.1* 2.0* 1.7*    Cardiac Enzymes Recent Labs  Lab 12/23/17 0600 12/23/17 0903  TROPONINI 0.03* 0.08*    Glucose Recent Labs  Lab 12/25/17 1543 12/25/17 1956 12/25/17 2306 12/25/17 2329 12/26/17 0434 12/26/17 0715  GLUCAP 72 71 64* 148* 72 69*    Imaging No results found.   STUDIES:  8/2 EEG > burst suppression pattern 8/2 CT angiogram chest> no pe, extensive bilateral airspace disease either due to aspiration or pulmonary edema 8/2 CT abdomen> expected findings post c-section 8/2 CT head > negative, some paranasal sinus disease 8/2 non contrast MRI brain within normal limits, no evidence of anoxia, no findings of PRES, MRV showed no sinovenous occlusive disease 8/2 Echo> LVEF normal  CULTURES: 8/2 resp culture GPC in pairs 8/2 blood >   ANTIBIOTICS: 8/2 vanc >  8/2 cefepime> 8/3 8/2 flagyl > 8/3 8/3 aztreonam >  SIGNIFICANT EVENTS: 8/1 elective C-section in setting of hypertension 38 weeks, seizure 8/2 cardiac  arrest  LINES/TUBES: 8/2 ETT 8/2 L IJ CVL   DISCUSSION: 42 y/o female with cardiac arrest after elective c-section for pre-eclampsia.  Given hypertension, frothy edema from ETT, bilateral infiltrates I think that acute pulmonary edema is most likely vs aspiration.  Ongoing abnormal EEG activity being treated as eclampsia with burst suppression and magnesium. Concern for anoxic brain injury remains.   ASSESSMENT / PLAN:  PULMONARY A: Acute respiratory failure with hypoxemia Acute pulmonary edema Possible aspiration pneumonia P:   Full mechanical vent support VAP prevention Daily WUA/SBT Evaluate for extubation as mental status allows  CARDIOVASCULAR A:  Hypertension > improved Hypotension in setting of high sedation, UOP adequate, no evidence of end organ damage P:  Tele CVP goal 9-12 Goal MAP after cardiac arrest is > 80 Monitor UOP closely  RENAL A:   No acute issues P:   Monitor BMET and UOP Replace electrolytes as needed   GASTROINTESTINAL A:   Mild shock liver > nearly resolved P:   Continue tube feeding Continue medical stress ulcer prophylaxis    HEMATOLOGIC A:   Dropping hemoglobin without evidnece of bleeding, suspect dilutional? No evidence of DIC P:  Monitor for bleeding Transfuse PRBC for Hgb < 7 gm/dL F/u haptoglobin  INFECTIOUS A:   Aspiration pneumonia?  P:   Stop vanc Continue aztreonam for now Monitor cultures  ENDOCRINE A:   No acute issues P:   Monitor glucose  NEUROLOGIC A:   Seizures eclampsia Concern for hypertensive encephalopathy Concern for anoxic brain injury P:   Stop magnesium drip > OK per OB Continue versed and propofol for burst suppresion keppra per neuro continueous eeg monitoring to continue Stop sedation after no seizures activity for 24 hours Assess neuro status again when off of sedation  Neuro normothermia protocol phase 1  FAMILY  - Updates: will update family bedside  - Inter-disciplinary  family meet or Palliative Care meeting due by:  Day 7   Tammy Parrett NP-C  [facility]  [phone]    12/26/2017, 10:11 AM  Attending Note:  42 year old female that recently had a C-section for pre-eclampsia and [facility]  [facility].  Patient continues to have seizure like activity on EEG per reading.  On exam, myoclonus continues but improved dramatically now that she is on proporol.  Will continue full vent support.  MRI done this AM and report is a normal appearance of the brain.  No family bedside this AM.  I suspect patient developed acute pulmonary edema  resulting in respiratory then cardiac arrest and has anoxic encephalopathy and seizure.  Will continue support for now.  The patient is critically ill with multiple organ systems failure and requires high complexity decision making for assessment and support, frequent evaluation and titration of therapies, application of advanced monitoring technologies and extensive interpretation of multiple databases.   Critical Care Time devoted to patient care services described in this note is  33  Minutes. This time reflects time of care of this signee Dr Koren Bound. This critical care time does not reflect procedure time, or teaching time or supervisory time of PA/NP/Med student/Med Resident etc but could involve care discussion time.  Laura Valentine, M.D. [facility] Pulmonary/Critical Care Medicine. Pager: [phone]. After hours pager: [phone].

## 2017-12-26 NOTE — Progress Notes (Signed)
vLTM EEG leads was removed for MRI. No skin breakdown noted

## 2017-12-26 NOTE — Progress Notes (Signed)
Dr. Dellie CatholicSommers made aware of borderline CBG's this shift and currently assessing, will continue to monitor.

## 2017-12-26 NOTE — Progress Notes (Signed)
Nutrition Follow-up  DOCUMENTATION CODES:   Obesity unspecified  INTERVENTION:   Tube Feeding:  Vital AF 1.2 @ 55 ml/hr Pro-Stat 30 mL daily Provides 114 g of protein, 1684 kcals, 1069 mL of free water. Meets 100% estimated protein needs  TF regimen and propofol at current rate providing 2061 total kcal/day    NUTRITION DIAGNOSIS:   Inadequate oral intake related to acute illness as evidenced by NPO status.  Being addressed via TF  GOAL:   Patient will meet greater than or equal to 90% of their needs  Progressing  MONITOR:   Vent status, TF tolerance, Weight trends, Labs, I & O's  REASON FOR ASSESSMENT:   Consult Enteral/tube feeding initiation and management  ASSESSMENT:   42 yo [facility] with severe preeclampsia and underwent C-section at 38 weeks and 2 days. Few hours post-op pt complained of difficulty breathing with subsequent cardiac arrest. [facility]. Pt with acute respiratory failure requiring  vent support. PMH of asthma, endometreiosis.   Seizures continue Patient is currently intubated on ventilator support MV: 12 L/min Temp (24hrs), Avg:98.9 F (37.2 C), Min:98.6 F (37 C), Max:99.5 F (37.5 C)  Propofol: 14.3 ml/hr (377 kcals)  Noted breast pumping attempts discontinued  Noted hypoglycemic episodes; CBGs 64-148. D5 started for hypoglycemia  Noted TF consult over the weekend; pt was started on Pro-Stat supplements RD received re-consult for TF today; begin enteral nutrition today  Utilize weight of 114 kg.   Labs: TG 310 Meds: D5 at 40 ml/hr, prenatal MVI  Diet Order:   Diet Order           Diet NPO time specified  Diet effective now          EDUCATION NEEDS:   Not appropriate for education at this time  Skin:  Skin Assessment: Skin Integrity Issues: Skin Integrity Issues:: Incisions Incisions: abdominal and perineum (8/1, C-section)  Last BM:  no  documented BM  Height:   Ht Readings from Last 1 Encounters:  12/22/17 5\' 3"  (1.6 m)    Weight:   Wt Readings from Last 1 Encounters:  12/25/17 253 lb 12 oz (115.1 kg)    Ideal Body Weight:  52.3 kg  BMI:  Body mass index is 44.95 kg/m.  Estimated Nutritional Needs:   Kcal:  2122 kcals  Protein:  105-132 g  Fluid:  >/= 1.8 L   Romelle Starcherate Hartlee Amedee MS, RD, LDN, CNSC [phone] Pager  [phone] Weekend/On-Call Pager

## 2017-12-27 ENCOUNTER — Inpatient Hospital Stay (HOSPITAL_COMMUNITY): Payer: Managed Care, Other (non HMO)

## 2017-12-27 ENCOUNTER — Inpatient Hospital Stay (HOSPITAL_COMMUNITY)
Admission: RE | Admit: 2017-12-27 | Payer: Managed Care, Other (non HMO) | Source: Ambulatory Visit | Admitting: Obstetrics and Gynecology

## 2017-12-27 DIAGNOSIS — J9601 Acute respiratory failure with hypoxia: Secondary | ICD-10-CM

## 2017-12-27 DIAGNOSIS — R509 Fever, unspecified: Secondary | ICD-10-CM

## 2017-12-27 LAB — POCT I-STAT 3, ART BLOOD GAS (G3+)
Acid-Base Excess: 4 mmol/L — ABNORMAL HIGH (ref 0.0–2.0)
Bicarbonate: 28.6 mmol/L — ABNORMAL HIGH (ref 20.0–28.0)
O2 Saturation: 99 %
PCO2 ART: 43.2 mmHg (ref 32.0–48.0)
PH ART: 7.433 (ref 7.350–7.450)
Patient temperature: 100.2
TCO2: 30 mmol/L (ref 22–32)
pO2, Arterial: 148 mmHg — ABNORMAL HIGH (ref 83.0–108.0)

## 2017-12-27 LAB — CBC
HEMATOCRIT: 25.5 % — AB (ref 36.0–46.0)
HEMOGLOBIN: 7.8 g/dL — AB (ref 12.0–15.0)
MCH: 25.6 pg — ABNORMAL LOW (ref 26.0–34.0)
MCHC: 30.6 g/dL (ref 30.0–36.0)
MCV: 83.6 fL (ref 78.0–100.0)
Platelets: 214 10*3/uL (ref 150–400)
RBC: 3.05 MIL/uL — ABNORMAL LOW (ref 3.87–5.11)
RDW: 15.9 % — AB (ref 11.5–15.5)
WBC: 5.6 10*3/uL (ref 4.0–10.5)

## 2017-12-27 LAB — GLUCOSE, CAPILLARY
GLUCOSE-CAPILLARY: 81 mg/dL (ref 70–99)
GLUCOSE-CAPILLARY: 89 mg/dL (ref 70–99)
GLUCOSE-CAPILLARY: 130 mg/dL — AB (ref 70–99)
GLUCOSE-CAPILLARY: 113 mg/dL — AB (ref 70–99)
Glucose-Capillary: 99 mg/dL (ref 70–99)
Glucose-Capillary: 89 mg/dL (ref 70–99)

## 2017-12-27 LAB — VALPROIC ACID LEVEL: Valproic Acid Lvl: 33 ug/mL — ABNORMAL LOW (ref 50.0–100.0)

## 2017-12-27 LAB — BASIC METABOLIC PANEL
ANION GAP: 10 (ref 5–15)
BUN: 8 mg/dL (ref 6–20)
CALCIUM: 7.4 mg/dL — AB (ref 8.9–10.3)
CO2: 28 mmol/L (ref 22–32)
Chloride: 104 mmol/L (ref 98–111)
Creatinine, Ser: 0.6 mg/dL (ref 0.44–1.00)
GFR calc non Af Amer: >60 mL/min (ref >60)
GLUCOSE: 97 mg/dL (ref 70–99)
Potassium: 3.1 mmol/L — ABNORMAL LOW (ref 3.5–5.1)
Sodium: 142 mmol/L (ref 135–145)

## 2017-12-27 LAB — MAGNESIUM
MAGNESIUM: 1.8 mg/dL (ref 1.7–2.4)
Magnesium: 1.7 mg/dL (ref 1.7–2.4)

## 2017-12-27 LAB — CK: CK TOTAL: 193 U/L (ref 38–234)

## 2017-12-27 LAB — PROCALCITONIN: PROCALCITONIN: 0.23 ng/mL

## 2017-12-27 LAB — PHOSPHORUS
PHOSPHORUS: 5 mg/dL — AB (ref 2.5–4.6)
Phosphorus: 4.1 mg/dL (ref 2.5–4.6)

## 2017-12-27 LAB — TRIGLYCERIDES: Triglycerides: 709 mg/dL — ABNORMAL HIGH (ref <150)

## 2017-12-27 MED ORDER — PHENOBARBITAL SODIUM 65 MG/ML IJ SOLN: 10.0000 mg/kg | Freq: Once | INTRAMUSCULAR | Status: DC

## 2017-12-27 MED ORDER — POTASSIUM CHLORIDE CRYS ER 20 MEQ PO TBCR
40.0000 meq | EXTENDED_RELEASE_TABLET | Freq: Once | ORAL | Status: AC
  Administered 2017-12-27: 40 meq via ORAL
  Filled 2017-12-27: qty 2

## 2017-12-27 MED ORDER — PROPOFOL 1000 MG/100ML IV EMUL
INTRAVENOUS | Status: AC
  Administered 2017-12-27: 150 ug/kg/min via INTRAVENOUS
  Filled 2017-12-27: qty 100

## 2017-12-27 MED ORDER — KETAMINE PEDS BOLUS VIA INFUSION
1.5000 mg/kg | Freq: Once | INTRAVENOUS | Status: AC
  Administered 2017-12-27: 171.6 mg via INTRAVENOUS

## 2017-12-27 MED ORDER — VECURONIUM BROMIDE 10 MG IV SOLR
10.0000 mg | Freq: Once | INTRAVENOUS | Status: AC
  Administered 2017-12-27: 10 mg via INTRAVENOUS

## 2017-12-27 MED ORDER — MAGNESIUM SULFATE 2 GM/50ML IV SOLN
2.0000 g | Freq: Once | INTRAVENOUS | Status: AC
  Administered 2017-12-27: 2 g via INTRAVENOUS
  Filled 2017-12-27: qty 50

## 2017-12-27 MED ORDER — FUROSEMIDE 10 MG/ML IJ SOLN
40.0000 mg | Freq: Once | INTRAMUSCULAR | Status: AC
  Administered 2017-12-27: 40 mg via INTRAVENOUS
  Filled 2017-12-27: qty 4

## 2017-12-27 MED ORDER — FENTANYL CITRATE (PF) 100 MCG/2ML IJ SOLN
INTRAMUSCULAR | Status: AC
  Filled 2017-12-27: qty 2

## 2017-12-27 MED ORDER — DOCUSATE SODIUM 50 MG/5ML PO LIQD
100.0000 mg | Freq: Two times a day (BID) | ORAL | Status: DC
  Administered 2017-12-27 – 2018-01-01 (×6): 100 mg
  Filled 2017-12-27 (×7): qty 10

## 2017-12-27 MED ORDER — PHENOBARBITAL SODIUM 65 MG/ML IJ SOLN
10.0000 mg/kg | INTRAMUSCULAR | Status: DC
  Filled 2017-12-27: qty 17.6

## 2017-12-27 MED ORDER — KETAMINE PEDS BOLUS VIA INFUSION
1.5000 mg/kg | INTRAVENOUS | Status: AC
  Administered 2017-12-27: 171.6 mg via INTRAVENOUS
  Filled 2017-12-27: qty 180

## 2017-12-27 MED ORDER — VALPROATE SODIUM 500 MG/5ML IV SOLN
500.0000 mg | Freq: Once | INTRAVENOUS | Status: AC
  Administered 2017-12-27: 500 mg via INTRAVENOUS
  Filled 2017-12-27: qty 5

## 2017-12-27 MED ORDER — PHENOBARBITAL SODIUM 130 MG/ML IJ SOLN
1144.0000 mg | Freq: Once | INTRAMUSCULAR | Status: AC
Start: 2017-12-27 — End: 2017-12-27
  Administered 2017-12-27: 1144 mg via INTRAVENOUS
  Filled 2017-12-27: qty 8.8

## 2017-12-27 MED ORDER — MIDAZOLAM BOLUS VIA INFUSION
10.0000 mg | INTRAVENOUS | Status: AC
  Administered 2017-12-27 (×4): 10 mg via INTRAVENOUS
  Filled 2017-12-27 (×3): qty 10

## 2017-12-27 MED ORDER — VECURONIUM BROMIDE 10 MG IV SOLR
INTRAVENOUS | Status: AC
  Administered 2017-12-27: 10 mg via INTRAVENOUS
  Filled 2017-12-27: qty 10

## 2017-12-27 MED ORDER — SODIUM CHLORIDE 0.9 % IV SOLN
7.0000 mg/kg/h | INTRAVENOUS | Status: DC
  Administered 2017-12-27: 5 mg/kg/h via INTRAVENOUS
  Administered 2017-12-27: 1 mg/kg/h via INTRAVENOUS
  Administered 2017-12-27: 7 mg/kg/h via INTRAVENOUS
  Administered 2017-12-27: 1 mg/kg/h via INTRAVENOUS
  Administered 2017-12-27 – 2017-12-28 (×8): 7 mg/kg/h via INTRAVENOUS
  Administered 2017-12-29: 5 mg/kg/h via INTRAVENOUS
  Administered 2017-12-29 (×2): 7 mg/kg/h via INTRAVENOUS
  Filled 2017-12-27: qty 10
  Filled 2017-12-27 (×2): qty 20
  Filled 2017-12-27: qty 10
  Filled 2017-12-27: qty 20
  Filled 2017-12-27: qty 10
  Filled 2017-12-27 (×3): qty 20
  Filled 2017-12-27 (×3): qty 10
  Filled 2017-12-27 (×3): qty 20
  Filled 2017-12-27: qty 10
  Filled 2017-12-27: qty 20
  Filled 2017-12-27 (×2): qty 10
  Filled 2017-12-27: qty 4
  Filled 2017-12-27 (×2): qty 20
  Filled 2017-12-27: qty 4
  Filled 2017-12-27 (×2): qty 10
  Filled 2017-12-27: qty 20
  Filled 2017-12-27: qty 10
  Filled 2017-12-27: qty 2

## 2017-12-27 MED ORDER — VALPROATE SODIUM 500 MG/5ML IV SOLN
1250.0000 mg | Freq: Three times a day (TID) | INTRAVENOUS | Status: DC
  Administered 2017-12-27 – 2017-12-28 (×3): 1250 mg via INTRAVENOUS
  Filled 2017-12-27 (×4): qty 12.5

## 2017-12-27 NOTE — Progress Notes (Signed)
Triglycerides have more than doubled since yesterday. She is at very high risk for PRIS and therefore I do not feel that we can continue burst suppression with this agent. She is already on very high doses of versed in addition to the propofol and I doubt that monotherapy with this will be adequate. I would favor starting ketamine to help maintain burst suppression and discontinue propofol.  Ritta SlotMcNeill Elmo Shumard, MD [facility] [phone]  If 7pm- 7am, please page neurology on call as listed in AMION.

## 2017-12-27 NOTE — Procedures (Signed)
LTM-EEG Report  HISTORY: Continuous video-EEG monitoring performed for 42 year old with anoxia, myoclonic status. ACQUISITION: International 10-20 system for electrode placement; 18 channels with additional eyes linked to ipsilateral ears and EKG. Additional T1-T2 electrodes were used. Continuous video recording obtained.   EEG NUMBER:  MEDICATIONS:  Day 4: propofol, MDZ, VPA, LEV  DAY #4: from 0730 12/26/17 to 0730 12/27/17 BACKGROUND: An overall low voltage discontinuous recording with poor spontaneous variability and absent reactivity. The background consisted of low voltage background suppression with superimposed medium voltage spikes as below. Spiking improved with increased sedation, leading to a burst-suppression pattern through the late afternoon and evening with periods of suppression lasting 5-10 seconds and bursts of polyspikes bilaterally.  EPILEPTIFORM/PERIODIC ACTIVITY: There were initially frequent/continuous polyspikes bilaterally up to 5hz  with clinical correlate of myoclonic jerking of the body. The spikes became less abundant with increased sedation through the afternoon and evening.  SEIZURES: Polyspikes with myoclonic seizures, resolved with increased sedation EVENTS: Myoclonic jerking as above  EKG: no significant arrhythmia  SUMMARY: This was a markedly abnormal continuous video EEG due to diffuse anoxic spiking with myoclonus and later a burst-suppression pattern. No reactivity was seen. This pattern was consistent with an anoxic injury pattern.

## 2017-12-27 NOTE — Progress Notes (Addendum)
PULMONARY / CRITICAL CARE MEDICINE   Name: Laura Valentine MRN: [id number] DOB: [birth year]    ADMISSION DATE:  12/22/2017 CONSULTATION DATE:  12/23/2017  REFERRING MD:  Laura CloudHorvath  CHIEF COMPLAINT:  Cardiac arrest  HISTORY OF PRESENT ILLNESS:   42 y/o female with asthma admitted G3 P1011 at 4828w2d [facility] on [facility]/1 in the setting of rising blood pressures.  She underwent a C-section that evening and received blood pressure medications and magnesium sulfate.  By [id number] on 8/2 she had a cardiac arrest.  She described trouble breathing prior and was then found unresponsive and pulseless.  CPR was performed and the patient received a shock during the code.  [facility] was [facility].    PAST MEDICAL HISTORY :  She  has a past medical history of Endometriosis, GERD (gastroesophageal reflux disease), HSV (herpes simplex virus) anogenital infection, and Infection.  SUBJECTIVE:  Stopped breast pumping per mother at bedside due to no milk production Overnight on LTM showing some burst pattern with intermittent burst of 1-3 sec, therefore , continues on Versed at 70 mg/hr and propofol and 150 mcg/kg/hr, requiring Neo at 20 mcg Currently, EEG showing burst suppression pattern per Neuro Tmax 100.9  VITAL SIGNS: BP 136/68   Pulse 92   Temp 100.2 F (37.9 C) (Oral) Comment: ice packs to axillae and groin  Resp 20   Ht 5\' 3"  (1.6 m)   Wt 252 lb 3.3 oz (114.4 kg)   LMP 03/29/2017   SpO2 97%   Breastfeeding? Unknown   BMI 44.68 kg/m   HEMODYNAMICS: CVP:  [7 mmHg] 7 mmHg  VENTILATOR SETTINGS: Vent Mode: PRVC FiO2 (%):  [40 %] 40 % Set Rate:  [16 bmp] 16 bmp Vt Set:  [520 mL] 520 mL PEEP:  [5 cmH20] 5 cmH20 Plateau Pressure:  [13 cmH20-23 cmH20] 21 cmH20  INTAKE / OUTPUT: I/O last 3 completed shifts: In: 5097 [I.V.:3147.4; NG/GT:1074.7; IV Piggyback:874.9] Out: 4680 [Urine:4310; Emesis/NG output:250; Other:120]  PHYSICAL  EXAMINATION: General:  Critically ill adult WF heavily sedated on MV in NAD HEENT: MM pink/moist, ETT/ OGT, pupils 3/brisk, EEG leads in place, L IJ site wnl Neuro: heavily sedated, unresponsive CV: rrr, no m/r/g PULM: even/non-labored on MV, breathing over set rate at 22,  lungs bilaterally coarse, clear thick secretions GI/ GU: soft, +BS, surgical incision/ dressing CDI, minimal dark blood on vaginal pad Extremities: warm/dry, generalized edema  Skin: no rashes   LABS:  BMET Recent Labs  Lab 12/24/17 1826 12/25/17 0436 12/27/17 0503  NA 139 141 142  K 4.7 5.0 3.1*  CL 110 110 104  CO2 24 26 28   BUN 8 5* 8  CREATININE 0.69 0.56 0.60  GLUCOSE 102* 79 97    Electrolytes Recent Labs  Lab 12/24/17 1826  12/25/17 0436  12/26/17 0430 12/26/17 1704 12/27/17 0503  CALCIUM 7.1*  --  7.2*  --   --   --  7.4*  MG  --    < > 4.5*   < > 1.8 1.8 1.7  PHOS 4.3  --  4.5   < > 4.3 4.7* 4.1   < > = values in this interval not displayed.    CBC Recent Labs  Lab 12/25/17 0436 12/26/17 0430 12/27/17 0503  WBC 8.6 6.2 5.6  HGB 7.5* 7.5* 7.8*  HCT 25.0* 24.9* 25.5*  PLT 212 199 214    Coag's No results for input(s): APTT, INR in the last 168 hours.  Sepsis Markers Recent Labs  Lab 12/23/17 0903 12/23/17 1407  LATICACIDVEN 2.3* 1.7    ABG Recent Labs  Lab 12/23/17 1016 12/24/17 0401 12/27/17 0338  PHART 7.355 7.354 7.433  PCO2ART 45.6 41.3 43.2  PO2ART 212.0* 129.0* 148.0*    Liver Enzymes Recent Labs  Lab 12/23/17 0903 12/23/17 2242 12/25/17 0436  AST 77* 62* 42*  ALT 55* 39 26  ALKPHOS 145* 112 94  BILITOT 0.5 0.6 0.5  ALBUMIN 2.1* 2.0* 1.7*    Cardiac Enzymes Recent Labs  Lab 12/23/17 0600 12/23/17 0903  TROPONINI 0.03* 0.08*    Glucose Recent Labs  Lab 12/26/17 0715 12/26/17 1108 12/26/17 1639 12/26/17 1935 12/26/17 2339 12/27/17 0405  GLUCAP 69* 69* 87 102* 91 81    Imaging Mr Brain Wo Contrast  Result Date:  12/26/2017 CLINICAL DATA:  Anoxic brain injury. EXAM: MRI HEAD WITHOUT CONTRAST TECHNIQUE: Multiplanar, multiecho pulse sequences of the brain and surrounding structures were obtained without intravenous contrast. COMPARISON:  Three days ago FINDINGS: Brain: No infarction, hemorrhage, hydrocephalus, extra-axial collection or mass lesion. No gray matter swelling or signal abnormality. Vascular: Major flow voids are preserved Skull and upper cervical spine: Diffusely low marrow signal on T1 weighted imaging attributed to recent pregnancy. Sinuses/Orbits: Mucosal thickening throughout the paranasal sinuses. Probable retention cysts in the right maxillary sinus. Partial bilateral mastoid opacification, new. There is nasopharyngeal fluid in the setting of intubation. IMPRESSION: 1. Stable normal appearance of the brain.  No evident anoxic injury. 2. Intubation with generalized sinusitis that has progressed from 3 days ago. New partial bilateral mastoid opacification. Electronically Signed   By: Marnee Spring M.D.   On: 12/26/2017 11:19   Dg Chest Port 1 View  Result Date: 12/27/2017 CLINICAL DATA:  Shortness of breath. EXAM: PORTABLE CHEST 1 VIEW COMPARISON:  12/25/2017.  12/24/2017. FINDINGS: Endotracheal tube, left IJ line, NG tube in stable position. Cardiomegaly with diffuse bilateral pulmonary infiltrates/edema with slight worsening from prior exam. Bibasilar atelectasis. Bilateral pleural effusions. No pneumothorax. IMPRESSION: 1.  Lines and tubes in stable position. 2. Cardiomegaly with bilateral pulmonary infiltrates/edema with slight worsening from prior exam. Bibasilar atelectasis and bilateral pleural effusions again noted. Electronically Signed   By: Maisie Fus  Register   On: 12/27/2017 07:01   STUDIES:  8/2 EEG > burst suppression pattern 8/2 CT angiogram chest> no pe, extensive bilateral airspace disease either due to aspiration or pulmonary edema 8/2 CT abdomen> expected findings post c-section 8/2  CT head > negative, some paranasal sinus disease 8/2 non contrast MRI brain within normal limits, no evidence of anoxia, no findings of PRES, MRV showed no sinovenous occlusive disease 8/2 Echo> LVEF normal 8/5 MRI brain >> stable appearance, no evidence of anoxic injury; progression of sinusitis progressed from 3 days ago 8/5 LTM showing frequent GPED, now with some evolution 8/6 AM burst suppression on LTM on versed 70 mg/hr/ propofol 150 mcg/hr  CULTURES: 8/2 trach asp >> norma flora 8/2 blood > ngtd 8/3 MRSA PCR >> neg  ANTIBIOTICS: 8/2 vanc > 8/4 8/2 cefepime> 8/3 8/2 flagyl > 8/3 8/3 aztreonam >  SIGNIFICANT EVENTS: 8/1 elective C-section in setting of hypertension 38 weeks, seizure 8/2 cardiac arrest 8/5 neg MRI, ongoing SE per LTM  8/6 Burst suppression on LTM  LINES/TUBES: 8/2 ETT >> 8/2 OGT >> 8/2 L IJ CVL >> 8/2 Foley   DISCUSSION: 42 y/o female with cardiac arrest after elective c-section for pre-eclampsia.  Given hypertension, frothy edema from ETT, bilateral infiltrates, this is most  likely acute pulmonary edema vs aspiration.  MRI 8/5 showing no evidence of anoxic or injury.  Ongoing abnormal EEG activity 8/5 being, with burst suppression obtained this morning on versed 70 mg/hr and propofol 150 mcg/kg/ min; with current goal to remain in burst suppression and start weaning sedation 8/7 evening.   Concern for anoxic brain injury remains.   ASSESSMENT / PLAN:  PULMONARY A: Acute respiratory failure with hypoxemia Acute pulmonary edema Bilateral pleural effusions Possible aspiration pneumonia P:   Full MV support, PRVC 8 cc/kg, rate 16/ peep 4/ 40% Intermittent CXR/ ABG VAP prevention See ID Lasix 40mg  x 1  Albuterol q 6 hr  CARDIOVASCULAR A:  Hypertension > improved Hypotension in setting of high sedation, UOP remains adequate, no evidence of end organ damage P:  Tele Neo for MAP goal  > 65 Lasix x 1  RENAL A:   Hypokalemia, Mag level  borderline at 1.7 Volume overload  - net +8L  P:   S/p KCL x 1, repeat at 1600 Replete Mag 2 gm  Lasix 40mg  once  Trend BMP /phos/ mag/ daily wt/ urinary output Replace electrolytes as indicated Avoid nephrotoxic agents, ensure adequate renal perfusion  GASTROINTESTINAL A:   Mild shock liver > resolved - No bowel movement since admission P:   Continue tube feeding Continue Pepcid for SUP Colace BID, dulcolax PRN Goal K >4  HEMATOLOGIC A:   Anemia- currently stable, possible dilutional - no evidence of bleeding P:  Trend CBC Monitor for bleeding Transfuse PRBC for Hgb < 7 gm/dL Haptoglobin normal  SCDs and Heparin SQ for VTE ppx   INFECTIOUS A:   Aspiration pneumonia given bilateral infiltrates on CXR and rising temp, however WBC remains normal  P:   Continue aztreonam  Follow blood cultures, trach asp neg Trend WBC/ fever curve Tylenol PRN fever  ENDOCRINE A:   No acute issues P:   CBG q 4  NEUROLOGIC A:   SE Eclampsia Concern for hypertensive encephalopathy Concern for anoxic brain injury remains - MRI 8/5 without evidence of injury/ anoxia P:   OB primary  Appreciate Neurology assistance Ongoing LTM, now in burst suppression as of 8/6 am Continue high dose versed and propofol for burst suppression, goal per Neurology to start wean sedation 8/7 evening keppra and valproate per neuro Intermittent valproic levels  Prevent fevers    FAMILY  - Updates:  Mother updated at the bedside.    - Inter-disciplinary family meet or Palliative Care meeting due by: 8/10  CCT 50 mins  Posey Boyer, AGACNP-BC [facility] Pgr: [phone] or if no answer [phone] 12/27/2017, 8:33 AM  Attending Note:  42 year old female s/p cardiac arrest who is in burst suppression.  Patient achieved burst suppression overnight and is now on benzos and propofol.  On exam, crackles noted and febrile.  I reviewed CXR myself, ETT is ok and pulmonary edema  noted.  Discussed with PCCM-NP and Neurology-MD.  Will check PCT, if positive will consider adding vanc.  Will continue full vent support and burst suppression til tomorrow night then on Thursday AM begin to titrate sedation down and attempt to perform a neuro exam.  MRI noted and is normal but I remain concerned about permanent brain damage.  Fever control via cooling blanket and tylenol.  PCCM will continue to follow.  The patient is critically ill with multiple organ systems failure and requires high complexity decision making for assessment and support, frequent evaluation and titration of  therapies, application of advanced monitoring technologies and extensive interpretation of multiple databases.   Critical Care Time devoted to patient care services described in this note is  34  Minutes. This time reflects time of care of this signee Dr Jennet Maduro. This critical care time does not reflect procedure time, or teaching time or supervisory time of PA/NP/Med student/Med Resident etc but could involve care discussion time.  Rush Farmer, M.D. [facility] Pulmonary/Critical Care Medicine. Pager: [phone]. After hours pager: [phone].

## 2017-12-27 NOTE — Progress Notes (Signed)
Follow up on this patient. Met with 2 family members in the visitation room-staff were working with patient.  Listened and prayed regarding the concerns they have. Conard Novak, Chaplain   12/27/17 1300  Clinical Encounter Type  Visited With Family;Patient not available  Visit Type Spiritual support;Critical Care  Referral From Chaplain  Consult/Referral To Chaplain  Spiritual Encounters  Spiritual Needs Prayer;Emotional  Stress Factors  Patient Stress Factors Health changes  Family Stress Factors Major life changes

## 2017-12-27 NOTE — Progress Notes (Signed)
Subjective: In burst suppression  Exam: Vitals:   12/27/17 0700 12/27/17 0713  BP: 136/68 136/68  Pulse: 92 92  Resp: 20 20  Temp:  (!) 100.9 F (38.3 C)  SpO2: 97% 97%   Gen: In bed, intubated Resp: ventilated Abd: soft, nt  Neuro: MS: Does not open eyes or follow commands. ON:GEXBMCN:PERRL, Further exam limited by burst suppression.   VPA level 33 - received total of 3750 yesterday.   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 Her suppressed background with GPEDs and burst suppression initially are strongly suggestive of poor prognosis.   That being said, with her young age and negative imaging, we are pursuing 48 hours of burst suppression. If myoclonic status were to resume following this, then I do not think that I would favor further suppression.   Recommendations: 1) Repeat MRI brain  2) increase depakote to 1250mg  TID, wil need to reduce once off propofol 4) continue keppra 1000mg  BID  This patient is critically ill and at significant risk of neurological worsening, death and care requires constant monitoring of vital signs, hemodynamics,respiratory and cardiac monitoring, neurological assessment, discussion with family, other specialists and medical decision making of high complexity. I spent 55 minutes of neurocritical care time  in the care of  this patient.  Ritta SlotMcNeill Deveion Denz, MD [facility] [phone]  If 7pm- 7am, please page neurology on call as listed in AMION. 12/27/2017  8:12 AM

## 2017-12-27 NOTE — Procedures (Signed)
Bronchoscopy Procedure Note Laura Valentine [id number] 1976-03-05  Procedure: Bronchoscopy Indications: Remove secretions  Procedure Details Consent: Risks of procedure as well as the alternatives and risks of each were explained to the (patient/caregiver).  Consent for procedure obtained. Time Out: Verified patient identification, verified procedure, site/side was marked, verified correct patient position, special equipment/implants available, medications/allergies/relevent history reviewed, required imaging and test results available.  Performed  In preparation for procedure, patient was given 100% FiO2 and bronchoscope lubricated. Sedation: Benzodiazepines, Muscle relaxants and Fentanyl  Airway entered and the following bronchi were examined: RUL, RML, RLL, LUL, LLL and Bronchi.   Thick bloody secretions in both main stem bronchi with complete obstruction on the right. Bronchoscope removed.  , Patient placed back on 100% FiO2 at conclusion of procedure.    Evaluation Hemodynamic Status: BP stable throughout; O2 sats: stable throughout Patient's Current Condition: stable Specimens:  Sent purulent fluid Complications: No apparent complications Patient did tolerate procedure well.   Laura Valentine,Laura Valentine 12/27/2017

## 2017-12-27 NOTE — Progress Notes (Signed)
LTM EEG checked, no skin breakdown noted.  

## 2017-12-27 NOTE — Progress Notes (Signed)
Reviewed EEG multiple times throughout the night.  Most recent time around 4.20 am showed burst suppression pattern with period of suppression lasting 6 to 15 seconds with bursts of 1-3 seconds. Continue Versed drip at 70cc/hr and Propofol at 11450mcg/kg/min.      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 She is at risk for significant risk of death from continuous seizures and anoxic injury as well complications of sedation and intubation such as respiratory failure, infection, heart failure.This patient's care requires constant monitoring of vital signs, hemodynamics, respiratory and cardiac monitoring, review of multiple databases, neurological assessment, discussion with family, other specialists and medical decision making of high complexity.  I spent 35 minutes of neurocritical time in the care of this patient.

## 2017-12-27 NOTE — Progress Notes (Signed)
Called by bedside RT, patient is desaturating to the 20's with suctioning.  On exam, no BS on the left that was audible.  We obtained an U/S machine and a sliding lung sign was noted.  Patient had a size 6 ETT and we were able to pass the small bronchoscope through it but the secretions were so thick that we were unable to remove any.  Secretions were blocking the left mainstem completely.  A combination of blood and sputum noted.    Patient evidently had stridor for 4 months prior to delivery per mother's report.  I am concerned that the reason a size 6 was placed was that CRNA was unable to pass a larger one.  Reintubation to bronch and remove secretions are essential right but as we were unable to remove secretions causing patient to saturate very poorly.  I spoke with husband and mother at length, informed them that the reintubation process can be very dangerous and can range from smooth replacement of the ETT to need for trach emergently to potentially death if unable to obtain airway.  They expressed understanding and signed consent.  We were able to replace the ETT without difficulty with a 7.5 then proceeded with bronchoscopy and removal of large amounts of secretions from both side and BAL was performed from the RLL.  Family notified.  Patient is now stable.  The patient is critically ill with multiple organ systems failure and requires high complexity decision making for assessment and support, frequent evaluation and titration of therapies, application of advanced monitoring technologies and extensive interpretation of multiple databases.   Critical Care Time devoted to patient care services described in this note is  60  Minutes. This time reflects time of care of this signee Dr Jennet Maduro. This critical care time does not reflect procedure time, or teaching time or supervisory time of PA/NP/Med student/Med Resident etc but could involve care discussion time.  Rush Farmer, M.D. [facility]  Pulmonary/Critical Care Medicine. Pager: [phone]. After hours pager: [phone].

## 2017-12-27 NOTE — Procedures (Signed)
Intubation Procedure Note Laura Valentine [id number] Nov 10, 1975  Procedure: Intubation Indications: Prior to bronchoscopy  Procedure Details Consent: Risks of procedure as well as the alternatives and risks of each were explained to the (patient/caregiver).  Consent for procedure obtained. Time Out: Verified patient identification, verified procedure, site/side was marked, verified correct patient position, special equipment/implants available, medications/allergies/relevent history reviewed, required imaging and test results available.  Performed  Maximum sterile technique was used including gloves, hand hygiene and mask.  MAC    Evaluation Hemodynamic Status: BP stable throughout; O2 sats: stable throughout Patient's Current Condition: stable Complications: No apparent complications Patient did tolerate procedure well. Chest X-ray ordered to verify placement.  Verified bronchoscopically.   Jennet Maduro 12/27/2017

## 2017-12-28 ENCOUNTER — Inpatient Hospital Stay (HOSPITAL_COMMUNITY): Payer: Managed Care, Other (non HMO)

## 2017-12-28 LAB — POCT I-STAT 3, ART BLOOD GAS (G3+)
Acid-base deficit: 1 mmol/L (ref 0.0–2.0)
Bicarbonate: 24.8 mmol/L (ref 20.0–28.0)
O2 SAT: 100 %
PH ART: 7.357 (ref 7.350–7.450)
PO2 ART: 175 mmHg — AB (ref 83.0–108.0)
Patient temperature: 98.2
TCO2: 26 mmol/L (ref 22–32)
pCO2 arterial: 44.1 mmHg (ref 32.0–48.0)

## 2017-12-28 LAB — CULTURE, BLOOD (SINGLE)
CULTURE: NO GROWTH
Culture: NO GROWTH
Special Requests: ADEQUATE

## 2017-12-28 LAB — DIC (DISSEMINATED INTRAVASCULAR COAGULATION)PANEL
Fibrinogen: 445 mg/dL (ref 210–475)
INR: 1.12
Platelets: 168 10*3/uL (ref 150–400)
Smear Review: NONE SEEN
aPTT: 30 seconds (ref 24–36)

## 2017-12-28 LAB — CBC
HCT: 23 % — ABNORMAL LOW (ref 36.0–46.0)
HEMATOCRIT: 23 % — AB (ref 36.0–46.0)
Hemoglobin: 6.7 g/dL — CL (ref 12.0–15.0)
Hemoglobin: 6.7 g/dL — CL (ref 12.0–15.0)
MCH: 25.1 pg — AB (ref 26.0–34.0)
MCH: 25 pg — ABNORMAL LOW (ref 26.0–34.0)
MCHC: 29.1 g/dL — ABNORMAL LOW (ref 30.0–36.0)
MCHC: 29.1 g/dL — ABNORMAL LOW (ref 30.0–36.0)
MCV: 86.1 fL (ref 78.0–100.0)
MCV: 85.8 fL (ref 78.0–100.0)
PLATELETS: 178 10*3/uL (ref 150–400)
Platelets: 167 10*3/uL (ref 150–400)
RBC: 2.67 MIL/uL — AB (ref 3.87–5.11)
RBC: 2.68 MIL/uL — AB (ref 3.87–5.11)
RDW: 16.2 % — ABNORMAL HIGH (ref 11.5–15.5)
RDW: 16.4 % — ABNORMAL HIGH (ref 11.5–15.5)
WBC: 7.2 10*3/uL (ref 4.0–10.5)
WBC: 7 10*3/uL (ref 4.0–10.5)

## 2017-12-28 LAB — GLUCOSE, CAPILLARY
GLUCOSE-CAPILLARY: 91 mg/dL (ref 70–99)
Glucose-Capillary: 84 mg/dL (ref 70–99)
Glucose-Capillary: 84 mg/dL (ref 70–99)
Glucose-Capillary: 93 mg/dL (ref 70–99)
Glucose-Capillary: 77 mg/dL (ref 70–99)
Glucose-Capillary: 81 mg/dL (ref 70–99)

## 2017-12-28 LAB — BASIC METABOLIC PANEL
Anion gap: 6 (ref 5–15)
BUN: 12 mg/dL (ref 6–20)
CHLORIDE: 117 mmol/L — AB (ref 98–111)
CO2: 26 mmol/L (ref 22–32)
Calcium: 7 mg/dL — ABNORMAL LOW (ref 8.9–10.3)
Creatinine, Ser: 0.49 mg/dL (ref 0.44–1.00)
GFR calc non Af Amer: >60 mL/min (ref >60)
Glucose, Bld: 88 mg/dL (ref 70–99)
POTASSIUM: 3 mmol/L — AB (ref 3.5–5.1)
SODIUM: 149 mmol/L — AB (ref 135–145)

## 2017-12-28 LAB — ABO/RH: ABO/RH(D): O POS

## 2017-12-28 LAB — VALPROIC ACID LEVEL
VALPROIC ACID LVL: 41 ug/mL — AB (ref 50.0–100.0)
VALPROIC ACID LVL: 59 ug/mL (ref 50.0–100.0)
Valproic Acid Lvl: 46 ug/mL — ABNORMAL LOW (ref 50.0–100.0)

## 2017-12-28 LAB — DIC (DISSEMINATED INTRAVASCULAR COAGULATION) PANEL
D DIMER QUANT: 13.69 ug{FEU}/mL — AB (ref 0.00–0.50)
PROTHROMBIN TIME: 14.3 s (ref 11.4–15.2)

## 2017-12-28 LAB — PREPARE RBC (CROSSMATCH)

## 2017-12-28 LAB — MAGNESIUM
Magnesium: 1.9 mg/dL (ref 1.7–2.4)
Magnesium: 1.8 mg/dL (ref 1.7–2.4)

## 2017-12-28 LAB — PROCALCITONIN: Procalcitonin: 0.15 ng/mL

## 2017-12-28 LAB — TRIGLYCERIDES: Triglycerides: 178 mg/dL — ABNORMAL HIGH (ref <150)

## 2017-12-28 LAB — PHOSPHORUS
Phosphorus: 3.6 mg/dL (ref 2.5–4.6)
Phosphorus: 3 mg/dL (ref 2.5–4.6)

## 2017-12-28 MED ORDER — DEXTROSE 5 % IV SOLN
1250.0000 mg | Freq: Four times a day (QID) | INTRAVENOUS | Status: DC
  Administered 2017-12-28 (×2): 1250 mg via INTRAVENOUS
  Filled 2017-12-28 (×4): qty 12.5

## 2017-12-28 MED ORDER — MINERAL OIL RE ENEM
1.0000 | ENEMA | Freq: Once | RECTAL | Status: AC
  Administered 2017-12-28: 1 via RECTAL
  Filled 2017-12-28 (×2): qty 1

## 2017-12-28 MED ORDER — SODIUM CHLORIDE 0.9% IV SOLUTION
Freq: Once | INTRAVENOUS | Status: AC
  Administered 2017-12-28: 14:00:00 via INTRAVENOUS

## 2017-12-28 MED ORDER — CEFTRIAXONE SODIUM 2 G IJ SOLR
2.0000 g | INTRAMUSCULAR | Status: DC
  Administered 2017-12-28 – 2017-12-29 (×2): 2 g via INTRAVENOUS
  Filled 2017-12-28 (×3): qty 20

## 2017-12-28 MED ORDER — POLYVINYL ALCOHOL 1.4 % OP SOLN
1.0000 [drp] | OPHTHALMIC | Status: DC | PRN
Start: 2017-12-28 — End: 2018-01-02
  Administered 2017-12-28 – 2017-12-29 (×3): 1 [drp] via OPHTHALMIC
  Filled 2017-12-28: qty 15

## 2017-12-28 MED ORDER — VALPROATE SODIUM 500 MG/5ML IV SOLN
1500.0000 mg | Freq: Four times a day (QID) | INTRAVENOUS | Status: DC
  Administered 2017-12-29 (×2): 1500 mg via INTRAVENOUS
  Filled 2017-12-28 (×3): qty 15

## 2017-12-28 MED ORDER — LACOSAMIDE 200 MG/20ML IV SOLN
200.0000 mg | Freq: Two times a day (BID) | INTRAVENOUS | Status: DC
  Administered 2017-12-28 – 2018-01-02 (×10): 200 mg via INTRAVENOUS
  Filled 2017-12-28 (×11): qty 20

## 2017-12-28 MED ORDER — PHENOBARBITAL SODIUM 65 MG/ML IJ SOLN
65.0000 mg | Freq: Every day | INTRAMUSCULAR | Status: DC
Start: 2017-12-28 — End: 2017-12-30
  Administered 2017-12-28 – 2017-12-30 (×3): 65 mg via INTRAVENOUS
  Filled 2017-12-28 (×4): qty 1

## 2017-12-28 MED ORDER — POTASSIUM CHLORIDE 10 MEQ/100ML IV SOLN
10.0000 meq | INTRAVENOUS | Status: AC
  Administered 2017-12-28 (×4): 10 meq via INTRAVENOUS
  Filled 2017-12-28 (×4): qty 100

## 2017-12-28 MED ORDER — POLYETHYLENE GLYCOL 3350 17 G PO PACK
17.0000 g | PACK | Freq: Two times a day (BID) | ORAL | Status: DC
Start: 2017-12-28 — End: 2018-01-02
  Administered 2017-12-28 – 2017-12-29 (×3): 17 g
  Filled 2017-12-28 (×6): qty 1

## 2017-12-28 MED ORDER — VALPROATE SODIUM 500 MG/5ML IV SOLN: 1500.0000 mg | Freq: Once | INTRAVENOUS | Status: DC

## 2017-12-28 NOTE — Procedures (Signed)
LTM-EEG Report  HISTORY: Continuous video-EEG monitoring performed for 42 year old with anoxia, myoclonic status. ACQUISITION: International 10-20 system for electrode placement; 18 channels with additional eyes linked to ipsilateral ears and EKG. Additional T1-T2 electrodes were used. Continuous video recording obtained.   EEG NUMBER:  MEDICATIONS:  Day 4: propofol, MDZ, VPA, LEV Day 5: propofol, MDZ, VPA, LEV  DAY #4: from 0730 12/26/17 to 0730 12/27/17 BACKGROUND: An overall low voltage discontinuous recording with poor spontaneous variability and absent reactivity. The background consisted of low voltage background suppression with superimposed medium voltage spikes as below. Spiking improved with increased sedation, leading to a burst-suppression pattern through the late afternoon and evening with periods of suppression lasting 5-10 seconds and bursts of polyspikes bilaterally.  EPILEPTIFORM/PERIODIC ACTIVITY: There were initially frequent/continuous polyspikes bilaterally up to 5hz  with clinical correlate of myoclonic jerking of the body. The spikes became less abundant with increased sedation through the afternoon and evening.  SEIZURES: Polyspikes with myoclonic seizures, resolved with increased sedation EVENTS: Myoclonic jerking as above  DAY #5: from 0730 12/27/17 to 0730 12/28/17 BACKGROUND: An overall low voltage discontinuous recording with poor spontaneous variability and absent reactivity. The background consisted of low voltage background suppression with superimposed medium voltage spikes as below. Burst-suppression transiently resolved in the early afternoon with increase in spiking, later going back into burst-suppression in the evening.  EPILEPTIFORM/PERIODIC ACTIVITY: There were polyspikes diffusely in bursts which became more continuous with tapering of sedation.  SEIZURES: no definite ictal evolution to spiking EVENTS: none  EKG: no significant arrhythmia  SUMMARY: This  was a markedly abnormal continuous video EEG due to diffuse spiking and a burst suppression pattern. No reactivity was seen. This pattern was consistent with an anoxic injury pattern and stable/unchanged over the past 24 hours.

## 2017-12-28 NOTE — Progress Notes (Addendum)
PULMONARY / CRITICAL CARE MEDICINE   Name: Laura Valentine MRN: [id number] DOB: 1975/05/30    ADMISSION DATE:  12/22/2017 CONSULTATION DATE:  12/23/2017  REFERRING MD:  Philis Pique  CHIEF COMPLAINT:  Cardiac arrest  HISTORY OF PRESENT ILLNESS:   42 y/o female with asthma [facility] in the setting of rising blood pressures.  She underwent a C-section that evening and received blood pressure medications and magnesium sulfate.  By 04967on 8/2 she had a cardiac arrest.  She described trouble breathing prior and was then found unresponsive and pulseless.  CPR was performed and the patient received a shock during the code.  [facility]    PAST MEDICAL HISTORY :  She  has a past medical history of Endometriosis, GERD (gastroesophageal reflux disease), HSV (herpes simplex virus) anogenital infection, and Infection.  SUBJECTIVE:  Currently on burst suppression Heavily sedated  VITAL SIGNS: BP 121/71 (BP Location: Right Arm)   Pulse 83   Temp 98 F (36.7 C) (Rectal)   Resp 20   Ht _0  (1.6 m)   Wt 274 lb 7.6 oz (124.5 kg)   LMP 03/29/2017   SpO2 99%   Breastfeeding? Unknown   BMI 48.62 kg/m   HEMODYNAMICS:    VENTILATOR SETTINGS: Vent Mode: PRVC FiO2 (%):  [30 %-100 %] 40 % Set Rate:  [16 bmp-20 bmp] 20 bmp Vt Set:  [520 mL] 520 mL PEEP:  [5 cmH20-10 cmH20] 10 cmH20 Plateau Pressure:  [21 cRFF63-84cmH20] 24 cmH20  INTAKE / OUTPUT: I/O last 3 completed shifts: In: 16310.7 [I.V.:12805.5; NG/GT:1875; IV Piggyback:1630.2] Out: 66659 [DJTTS:1779 Emesis/NG output:250]  PHYSICAL EXAMINATION: General: Obese female on heavy sedation HEENT: Endotracheal tube to ventilator.  Gastric tube tube feeding at 55 Neuro: Heavily sedated CV: s1s2 rrr, no m/r/g PULM: even/non-labored, lungs bilaterally diminished in the bases GTJ:QZES non-tender, faint bowel, scant vaginal  bleeding Extremities: warm/dry, 1+edema  Skin: no rashes or lesions    LABS:  BMET Recent Labs  Lab 12/25/17 0436 12/27/17 0503 12/28/17 0445  NA 141 142 149*  K 5.0 3.1* 3.0*  CL 110 104 117*  CO2 _1 BUN 5* 8 12  CREATININE 0.56 0.60 0.49  GLUCOSE 79 97 88    Electrolytes Recent Labs  Lab 12/25/17 0436  12/27/17 0503 12/27/17 1644 12/28/17 0445  CALCIUM 7.2*  --  7.4*  --  7.0*  MG 4.5*   < > 1.7 1.8 1.9  PHOS 4.5   < > 4.1 5.0* 3.6   < > = values in this interval not displayed.    CBC Recent Labs  Lab 12/26/17 0430 12/27/17 0503 12/28/17 0445 12/28/17 0635  WBC 6.2 5.6 7.2  --   HGB 7.5* 7.8* 6.7*  --   HCT 24.9* 25.5* 23.0*  --   PLT 199 214 167 168    Coag's Recent Labs  Lab 12/28/17 0635  APTT 30  INR 1.12    Sepsis Markers Recent Labs  Lab 12/23/17 0903 12/23/17 1407 12/27/17 1023 12/28/17 0445  LATICACIDVEN 2.3* 1.7  --   --   PROCALCITON  --   --  0.23 0.15    ABG Recent Labs  Lab 12/24/17 0401 12/27/17 0338 12/28/17 0309  PHART 7.354 7.433 7.357  PCO2ART 41.3 43.2 44.1  PO2ART 129.0* 148.0* 175.0*    Liver Enzymes Recent Labs  Lab 12/23/17 0903 12/23/17 2242 12/25/17 0436  AST 77* 62* 42*  ALT 55* 39 26  ALKPHOS 145* 112 94  BILITOT 0.5 0.6 0.5  ALBUMIN 2.1* 2.0* 1.7*    Cardiac Enzymes Recent Labs  Lab 12/23/17 0600 12/23/17 0903  TROPONINI 0.03* 0.08*    Glucose Recent Labs  Lab 12/27/17 1210 12/27/17 1608 12/27/17 1933 12/27/17 2314 12/28/17 0315 12/28/17 0720  GLUCAP 99 130* 89 113* 84 84    Imaging Dg Chest Port 1 View  Result Date: 12/28/2017 CLINICAL DATA:  Hypoxia EXAM: PORTABLE CHEST 1 VIEW COMPARISON:  December 27, 2017 FINDINGS: Endotracheal tube tip is 3.4 cm above the carina. Nasogastric tube tip and side port are below the diaphragm. Central catheter tip is in superior vena cava. No pneumothorax. There is airspace consolidation in the left lower lobe with small left pleural  effusion. There is atelectatic change in the right base. Heart is upper normal in size with pulmonary vascularity normal. No adenopathy. No bone lesions. IMPRESSION: Tube and catheter positions as described without pneumothorax. Persistent consolidation left lower lobe with small left pleural effusion. Stable right base atelectasis. No new opacity evident. Stable cardiac silhouette. Electronically Signed   By: Lowella Grip III M.D.   On: 12/28/2017 07:48   Dg Chest Port 1 View  Result Date: 12/27/2017 CLINICAL DATA:  Difficult intubation following code blue. EXAM: PORTABLE CHEST 1 VIEW COMPARISON:  Chest x-ray of December 27, 2017 at 4:51 a.m. FINDINGS: The endotracheal tube tip projects between the clavicular heads approximately 5.4 cm above the carina. The esophagogastric tube tip and proximal port project below the left hemidiaphragm. The left internal jugular venous catheter tip projects over the proximal SVC. The lungs are adequately inflated. The lung markings are coarse in both lower lobes but this has improved since yesterday's study. There is no pneumothorax, pneumomediastinum, or pleural effusion. The heart is normal in size. The pulmonary vascularity is less engorged and more distinct. IMPRESSION: No evidence of pneumothorax or pneumomediastinum. The mediastinum is not abnormally widened. Improving appearance of the pulmonary interstitium with consistent with decreased interstitial edema. Persistent bibasilar atelectasis or pneumonia, improving. The support tubes are in reasonable position. Electronically Signed   By: David  Martinique M.D.   On: 12/27/2017 13:35   STUDIES:  8/2 EEG > burst suppression pattern 8/2 CT angiogram chest> no pe, extensive bilateral airspace disease either due to aspiration or pulmonary edema 8/2 CT abdomen> expected findings post c-section 8/2 CT head > negative, some paranasal sinus disease 8/2 non contrast MRI brain within normal limits, no evidence of anoxia, no  findings of PRES, MRV showed no sinovenous occlusive disease 8/2 Echo> LVEF normal 8/5 MRI brain >> stable appearance, no evidence of anoxic injury; progression of sinusitis progressed from 3 days ago 8/5 LTM showing frequent GPED, now with some evolution 8/6 AM burst suppression on LTM on versed 70 mg/hr/ propofol 150 mcg/hr  CULTURES: 8/2 trach asp >> norma flora 8/2 blood > ngtd day 4 8/3 MRSA PCR >> neg  ANTIBIOTICS: 8/2 vanc > 8/4 8/2 cefepime> 8/3 8/2 flagyl > 8/3 8/3 aztreonam >  SIGNIFICANT EVENTS: 8/1 elective C-section in setting of hypertension 38 weeks, seizure 8/2 cardiac arrest 8/5 neg MRI, ongoing SE per LTM  8/6 Burst suppression on LTM  LINES/TUBES: 8/2 ETT >> 12/27/2017 replaced with 7.5 on 12/27/2017>> 8/2 OGT >> 8/2 L IJ CVL >> 8/2 Foley   DISCUSSION: 42 y/o female with cardiac arrest after elective c-section for pre-eclampsia.  Given hypertension, frothy edema from ETT, bilateral infiltrates, this  is most likely acute pulmonary edema vs aspiration.  MRI 8/5 showing no evidence of anoxic or injury.  Ongoing abnormal EEG activity 8/5 being, with burst suppression obtained this morning on versed 70 mg/hr and propofol 150 mcg/kg/ min; with current goal to remain in burst suppression and start weaning sedation 8/7 evening.   Concern for anoxic brain injury remains.  Currently undergoing burst suppression per neurology.  Poor prognosis.  ASSESSMENT / PLAN:  PULMONARY A: Acute respiratory failure with hypoxemia Acute pulmonary edema Bilateral pleural effusions Possible aspiration pneumonia P:   Vent bundle Currently heavily sedated therefore no weaning Left lower lobe mucus plugging noted following fiberoptic bronchoscopy 12/27/2017 Tube change 12/27/2017 at 6.57.5 Pulmonary toilet Antimicrobial therapy Consider further diuresis as blood pressure tolerates once CVP has been checked  CARDIOVASCULAR A:  Hypertension >resolved Hypotension in setting of high  sedation, UOP remains adequate, no evidence of end organ damage P:  Monitor cvp Pressor support  RENAL Lab Results  Component Value Date   CREATININE 0.49 12/28/2017   CREATININE 0.60 12/27/2017   CREATININE 0.56 12/25/2017   Recent Labs  Lab 12/25/17 0436 12/27/17 0503 12/28/17 0445  K 5.0 3.1* 3.0*    A:   Hypokalemia Volume overload  - net +8L  P:   Replete potassium on 12/28/2017 Monitor magnesium and phosphorus Despite being on vasopressor she is volume overloaded  GASTROINTESTINAL A:   Mild shock liver > resolved - No bowel movement since admission P:   No bowel movements noted Is on high-dose Versed for burst suppression Currently tube feedings are going to 55 an hour Faint bowel sounds are noted on 12/28/2017 There is a concern for ileus and the patient is on high-dose Versed we will continue to monitor low threshold for discontinuing tube feeds.  HEMATOLOGIC Recent Labs    12/27/17 0503 12/28/17 0445  HGB 7.8* 6.7*    A:   Anemia- currently stable, possible dilutional - no evidence of bleeding P:  Trend hemoglobin noted at 044 5 hours on 12/28/2017 noted to be 6.7 a repeat hemoglobin is been ordered for 12 noon on 12/28/2017 if less than 7 will transfuse. Transfuse per protocol for hemoglobin less than 7 Monitor vaginal bleeding noted on 12/28/2017 to be scant  INFECTIOUS A:   Aspiration pneumonia given bilateral infiltrates on CXR and rising temp, however WBC remains normal  P:   Continue current antibiotics Note she had a BAL performed on bronchoscopy on 12/27/2017 await results Treat fever  ENDOCRINE CBG (last 3)  Recent Labs    12/27/17 2314 12/28/17 0315 12/28/17 0720  GLUCAP 113* 84 84    A:   No acute issues P:   Sliding scale insulin protocol  NEUROLOGIC A:   SE Eclampsia Concern for hypertensive encephalopathy Concern for anoxic brain injury remains - MRI 8/5 without evidence of injury/ anoxia P:   Currently on burst  suppression per neurology is to last to 6800 hours on 12/28/2017 Prognosis remains poor but due to her relatively young age and the fact her MRI and CT scan do not demonstrate anoxic injury we continue with current interventions. Anticonvulsants per neuro Burst suppression per neuro Monitor for fevers    FAMILY  - Updates: 12/28/2017 no family currently at bedside.  - Inter-disciplinary family meet or Palliative Care meeting due by: 8/10  CCT 30 mins  Richardson Landry Minor ACNP Maryanna Shape PCCM Pager [phone] till 1 pm If no answer page [phone] 12/28/2017, 9:54 AM  Attending Note:  41  year old female s/p cardiac arrest and now with anoxic brain injury who remains seizing but now in burst suppression pattern.  On exam, she is completely unresponsive with clear lungs.  I reviewed CXR myself, ETT is in a good position.  Discussed with PCCM-NP.  Patient has not had a bowel movement since delivery and now is no longer desaturating.  Decrease PEEP to 5.  D/C aztreonam and replace with rocephin since PCT is reassuring and BAL cultures are negative today.  If BAL remains negative in AM may consider d/c of abx.  Continue TF.  Add miralax and fleet enema for bowel movement.  Continue pressors as needed for BP support.  PCCM will continue to follow.  The patient is critically ill with multiple organ systems failure and requires high complexity decision making for assessment and support, frequent evaluation and titration of therapies, application of advanced monitoring technologies and extensive interpretation of multiple databases.   Critical Care Time devoted to patient care services described in this note is  33  Minutes. This time reflects time of care of this signee Dr Jennet Maduro. This critical care time does not reflect procedure time, or teaching time or supervisory time of PA/NP/Med student/Med Resident etc but could involve care discussion time.  Rush Farmer, M.D. [facility] Pulmonary/Critical Care  Medicine. Pager: [phone]. After hours pager: [phone].

## 2017-12-28 NOTE — Progress Notes (Signed)
Subjective: In burst suppression, still with epileptiform appearance to bursts  Exam: Vitals:   12/28/17 0745 12/28/17 0800  BP: 118/68 121/71  Pulse: 81 83  Resp: 20 20  Temp:  98 F (36.7 C)  SpO2: 99% 99%   Gen: In bed, intubated Resp: ventilated Abd: soft, nt  Neuro: MS: Does not open eyes or follow commands. ZO:XWRUECN:PERRL, Further exam limited by burst suppression.   VPA level 41  Impression: 42 yo F with anoxic brain injury s/p cardiac arrest. Her suppressed background with GPEDs and burst suppression initially are strongly suggestive of poor prognosis.   That being said, with her young age and negative imaging, we are pursuing 48 hours of burst suppression. If myoclonic status were to resume following this, then I do not think that I would favor further suppression. I discussed this with the husband this morning.   Due to increasing triglycerides, she was changed from propofol to ketamine/versed yesterday. She was given a dose of phenobarbital to help achieve burst suppression.   Recommendations: 1) increase depakote to 1250mg  QID 2) check level this afternoon, will need theraputic level prior to weaning  3) continue phenobarb 65mg  daily.  4) continue keppra 1500mg  BID 5) liekly to need to discuss prognosis tomorrow.    This patient is critically ill and at significant risk of neurological worsening, death and care requires constant monitoring of vital signs, hemodynamics,respiratory and cardiac monitoring, neurological assessment, discussion with family, other specialists and medical decision making of high complexity. I spent 50 minutes of neurocritical care time  in the care of  this patient.  Ritta SlotMcNeill Sherleen Pangborn, MD [facility] [phone]  If 7pm- 7am, please page neurology on call as listed in AMION. 12/28/2017  9:05 AM

## 2017-12-28 NOTE — Progress Notes (Signed)
Patient seen and examined.  Hgb slowly trended down to 6.7 since delivery (preop hemoglobin 10.0).  Received 1U PRBC today  Pt sedated and intubated.  Abd: mildly distended, but soft.  Minimal bowel sounds.  Fundus firm and below umbilicus Pelvic: peripad with scant bleeding.   Discussed status with ICU nursing and reviewed interdisciplinary notes.  Agree with PCCM that declining hemoglobin is likely a combination of acute blood loss from surgery with some component of hemodilution.  No evidence of intra-abdominal bleeding and lochia is minimal and appropriate.  Bowel sounds on exam are faint, but abdomen soft and minimally distended. If distension worsens, may consider holding tube feeds, but agree with plans to continue tube feeds at that time.   May remove dressing on POD#7.   Management much appreciated. Please contact our service with any questions [phone].

## 2017-12-28 NOTE — Progress Notes (Signed)
Patient had small BM smear.  Partial linen change done  New gown placed.  All wires and tubes untangled.  Eye care done.  Some mild lochia noted.  Peri care done.  Inner thighs noted to be raw and red.  Barrier cream applied.  Some bleeding noted from left nare while patient was partially supine.  yankeur used to clear nasal passage.  Only small amount of blood noted.  VSS throughout procedure.  Core temp within limits.  Will continue to closely monitor and start weaning of the versed at 2100 as ordered

## 2017-12-28 NOTE — Progress Notes (Signed)
eLink Physician-Brief Progress Note Patient Name: Laura GrebeKimberly Hawes DOB: Apr 15, 1976 MRN: [id number]   Date of Service  12/28/2017  HPI/Events of Note  Hgb 6.7. Per RN, no overt bleeding. Pt is on 500 cc/hr due to continuous infusions of ketamine and midazolam @ high doses... Suspect dilutional  eICU Interventions  Check DIC panel Type and screen Recheck CBC @ 12N      Intervention Category Major Interventions: Other:  Merwyn KatosDavid B Mekala Winger 12/28/2017, 6:19 AM

## 2017-12-28 NOTE — Plan of Care (Signed)

## 2017-12-28 NOTE — Progress Notes (Addendum)
CRITICAL VALUE ALERT  Critical Value:  Hemoglobin 6.7  Date & Time Notied: 12/28/17; 1230pm   Provider Notified: Devra DoppSteve Minor, NP  Orders Received/Actions taken: Transfuse one unit of PRBC

## 2017-12-29 ENCOUNTER — Inpatient Hospital Stay (HOSPITAL_COMMUNITY): Payer: Managed Care, Other (non HMO)

## 2017-12-29 LAB — CBC
HEMATOCRIT: 25.6 % — AB (ref 36.0–46.0)
HEMOGLOBIN: 7.7 g/dL — AB (ref 12.0–15.0)
MCH: 25.8 pg — AB (ref 26.0–34.0)
MCHC: 30.1 g/dL (ref 30.0–36.0)
MCV: 85.9 fL (ref 78.0–100.0)
Platelets: 193 10*3/uL (ref 150–400)
RBC: 2.98 MIL/uL — ABNORMAL LOW (ref 3.87–5.11)
RDW: 16.6 % — ABNORMAL HIGH (ref 11.5–15.5)
WBC: 11.3 10*3/uL — ABNORMAL HIGH (ref 4.0–10.5)

## 2017-12-29 LAB — BLOOD GAS, ARTERIAL
Acid-base deficit: 2.9 mmol/L — ABNORMAL HIGH (ref 0.0–2.0)
Bicarbonate: 21.6 mmol/L (ref 20.0–28.0)
DRAWN BY: 347621
FIO2: 40
O2 SAT: 89.9 %
PATIENT TEMPERATURE: 98.6
PCO2 ART: 38.7 mmHg (ref 32.0–48.0)
PEEP: 5 cmH2O
PH ART: 7.365 (ref 7.350–7.450)
RATE: 20 resp/min
VT: 520 mL
pO2, Arterial: 59.6 mmHg — ABNORMAL LOW (ref 83.0–108.0)

## 2017-12-29 LAB — BPAM RBC
BLOOD PRODUCT EXPIRATION DATE: 201909062359
ISSUE DATE / TIME: 201908071332
UNIT TYPE AND RH: 5100

## 2017-12-29 LAB — APTT: APTT: 31 s (ref 24–36)

## 2017-12-29 LAB — BASIC METABOLIC PANEL
Anion gap: 4 — ABNORMAL LOW (ref 5–15)
BUN: 12 mg/dL (ref 6–20)
CHLORIDE: 124 mmol/L — AB (ref 98–111)
CO2: 22 mmol/L (ref 22–32)
Calcium: 7.6 mg/dL — ABNORMAL LOW (ref 8.9–10.3)
Creatinine, Ser: 0.55 mg/dL (ref 0.44–1.00)
GFR calc Af Amer: >60 mL/min (ref >60)
GFR calc non Af Amer: >60 mL/min (ref >60)
Glucose, Bld: 82 mg/dL (ref 70–99)
POTASSIUM: 4.3 mmol/L (ref 3.5–5.1)
SODIUM: 150 mmol/L — AB (ref 135–145)

## 2017-12-29 LAB — TYPE AND SCREEN
ABO/RH(D): O POS
Antibody Screen: NEGATIVE
Unit division: 0

## 2017-12-29 LAB — GLUCOSE, CAPILLARY
GLUCOSE-CAPILLARY: 82 mg/dL (ref 70–99)
Glucose-Capillary: 81 mg/dL (ref 70–99)
Glucose-Capillary: 89 mg/dL (ref 70–99)
Glucose-Capillary: 105 mg/dL — ABNORMAL HIGH (ref 70–99)
Glucose-Capillary: 125 mg/dL — ABNORMAL HIGH (ref 70–99)
Glucose-Capillary: 134 mg/dL — ABNORMAL HIGH (ref 70–99)

## 2017-12-29 LAB — PROCALCITONIN: Procalcitonin: 0.12 ng/mL

## 2017-12-29 LAB — VALPROIC ACID LEVEL
VALPROIC ACID LVL: 91 ug/mL (ref 50.0–100.0)
Valproic Acid Lvl: 45 ug/mL — ABNORMAL LOW (ref 50.0–100.0)

## 2017-12-29 LAB — PHOSPHORUS
PHOSPHORUS: 2.5 mg/dL (ref 2.5–4.6)
Phosphorus: 2.6 mg/dL (ref 2.5–4.6)

## 2017-12-29 LAB — PROTIME-INR
INR: 1.11
Prothrombin Time: 14.3 seconds (ref 11.4–15.2)

## 2017-12-29 LAB — MAGNESIUM
MAGNESIUM: 1.8 mg/dL (ref 1.7–2.4)
Magnesium: 1.9 mg/dL (ref 1.7–2.4)

## 2017-12-29 MED ORDER — LORAZEPAM 2 MG/ML IJ SOLN
INTRAMUSCULAR | Status: AC
  Administered 2017-12-29: 4 mg
  Filled 2017-12-29: qty 2

## 2017-12-29 MED ORDER — VALPROATE SODIUM 500 MG/5ML IV SOLN
1750.0000 mg | Freq: Four times a day (QID) | INTRAVENOUS | Status: DC
  Administered 2017-12-29 – 2017-12-31 (×8): 1750 mg via INTRAVENOUS
  Filled 2017-12-29 (×9): qty 17.5

## 2017-12-29 MED ORDER — LABETALOL HCL 5 MG/ML IV SOLN
INTRAVENOUS | Status: AC
  Administered 2017-12-29: 10 mg via INTRAVENOUS
  Filled 2017-12-29: qty 4

## 2017-12-29 MED ORDER — SODIUM CHLORIDE 0.9 % IV SOLN
2.0000 mg/kg/h | INTRAVENOUS | Status: DC
  Administered 2017-12-29 – 2017-12-30 (×3): 2 mg/kg/h via INTRAVENOUS
  Filled 2017-12-29 (×4): qty 20

## 2017-12-29 MED ORDER — GERHARDT'S BUTT CREAM
TOPICAL_CREAM | Freq: Three times a day (TID) | CUTANEOUS | Status: DC
  Administered 2017-12-29 – 2018-01-02 (×12): via TOPICAL
  Filled 2017-12-29: qty 1

## 2017-12-29 MED ORDER — LABETALOL HCL 5 MG/ML IV SOLN
10.0000 mg | INTRAVENOUS | Status: DC | PRN
  Administered 2017-12-29 (×2): 10 mg via INTRAVENOUS
  Filled 2017-12-29 (×2): qty 4

## 2017-12-29 MED ORDER — VITAL HIGH PROTEIN PO LIQD
1000.0000 mL | ORAL | Status: DC
  Administered 2017-12-29 – 2018-01-01 (×4): 1000 mL

## 2017-12-29 MED ORDER — VALPROATE SODIUM 500 MG/5ML IV SOLN
1000.0000 mg | Freq: Once | INTRAVENOUS | Status: AC
  Administered 2017-12-29: 1000 mg via INTRAVENOUS
  Filled 2017-12-29: qty 10

## 2017-12-29 MED ORDER — LORAZEPAM 2 MG/ML IJ SOLN: 4.0000 mg | Freq: Once | INTRAMUSCULAR | Status: AC

## 2017-12-29 MED ORDER — FUROSEMIDE 10 MG/ML IJ SOLN
40.0000 mg | Freq: Two times a day (BID) | INTRAMUSCULAR | Status: DC
  Administered 2017-12-29 – 2018-01-02 (×9): 40 mg via INTRAVENOUS
  Filled 2017-12-29 (×9): qty 4

## 2017-12-29 MED ORDER — DEXMEDETOMIDINE HCL IN NACL 400 MCG/100ML IV SOLN
0.4000 ug/kg/h | INTRAVENOUS | Status: DC
  Administered 2017-12-29: 0.5 ug/kg/h via INTRAVENOUS
  Administered 2017-12-29: 0.4 ug/kg/h via INTRAVENOUS
  Administered 2017-12-30: 0.8 ug/kg/h via INTRAVENOUS
  Administered 2017-12-30: 0.7 ug/kg/h via INTRAVENOUS
  Filled 2017-12-29 (×3): qty 100

## 2017-12-29 NOTE — Progress Notes (Signed)
Subjective: In burst suppression, versed is now off, ketamine is weaning.   Exam: Vitals:   12/29/17 0741 12/29/17 0800  BP:  131/74  Pulse:  (!) 105  Resp:  (!) 27  Temp: 99.8 F (37.7 C) 99.9 F (37.7 C)  SpO2:  93%   Gen: In bed, intubated Resp: ventilated Abd: soft, nt  Neuro: MS: Does not open eyes or follow commands. ZO:XWRUECN:PERRL, Further exam limited by burst suppression.   VPA level 59 yesterday 45 today.   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 Her suppressed background with GPEDs and burst suppression initially are strongly suggestive of poor prognosis.   That being said, with her young age and negative imaging, we pursued 48 hours of burst suppression. She is now in the process of weaning from sedation.   If myoclonic status were to resume then I do not think that I would favor further suppression.   Due to increasing triglycerides, she was changed from versed/propfol  to ketamine/versed. She is currently on 4 AEDs.   Recommendations: 1) increase depakote to 1750mg  QID, additional 1g now 2) check level this afternoon,  3) continue phenobarb 65mg  daily.  4) continue keppra 1500mg  BID  This patient is critically ill and at significant risk of neurological worsening, death and care requires constant monitoring of vital signs, hemodynamics,respiratory and cardiac monitoring, neurological assessment, discussion with family, other specialists and medical decision making of high complexity. I spent 45 minutes of neurocritical care time  in the care of  this patient.  Ritta SlotMcNeill Everitt Wenner, MD [facility] [phone]  If 7pm- 7am, please page neurology on call as listed in AMION. 12/29/2017  8:39 AM

## 2017-12-29 NOTE — Progress Notes (Signed)
RN noticed the 1800 dose of valproate did not infuse. Dr. Laurence SlateAroor (neurology) notified and Mardelle Mattendy, [facility] advised to give next dose as scheduled at 0000.

## 2017-12-29 NOTE — Lactation Note (Signed)
Lactation Consultation Note  Patient Name: Laura GrebeKimberly Bahar ZOXWR'UToday's Date: 12/29/2017   Received a call from mom nurse that mom has stopped pumping and asked where to return the breast pump. Called back and spoke with Domingo Dimesylan, RN who reports mom has stopped producing milk a few days ago and they have stopped pumping. RN denies that mom has any engorgement at this time. Advised that DEBP was obtained from Peds unit [facility], [facility] and can be returned there.      Maternal Data    Feeding    LATCH Score                   Interventions    Lactation Tools Discussed/Used     Consult Status      Laura Valentine 12/29/2017, 12:06 PM

## 2017-12-29 NOTE — Progress Notes (Signed)
LTM EEG checked. Paste added to C3, C4, Cz, Pz, P3

## 2017-12-29 NOTE — Progress Notes (Addendum)
At 1920, RN arrived to room with previous shift RN to give handoff and patient noticed to have myoclonic jerking, possible seizure activity. Dr. Molli KnockYacoub notified and gave this RN verbal order for 4 mg Ativan IV once and to await neuro MD call back. Dr. Laurence SlateAroor (neurology) called back and ordered ketamine. Seizure activity has stopped at this time. Plan per Dr. Laurence SlateAroor is to prevent seizures overnight. Will notify MD for further seizure activity. Will continue to assess.

## 2017-12-29 NOTE — Consult Note (Addendum)
WOC Nurse wound consult note Reason for Consult: Consult requested for inner groin/perineum Wound type: Red moist macerated skin, with scattered clear fluid-filled blisters and weeping yellow fluid.  Appearance is consistent with moisture associated skin damage from lochia and previously incontinent of liquid stool.  Flexiseal has been inserted to attempt to contain stool. Dressing procedure/placement/frequency: Dressings to this location would frequently become soiled.  Apply barrier cream to repel moisture and stoma powder to promote drying and healing.  No family present to discuss plan of care and patient is not responsive at this time.  She is on a low airloss bed to reduce pressure. Please re-consult if further assistance is needed.  Thank-you,  Cammie Mcgeeawn Lekha Dancer MSN, RN, CWOCN, [place]AP, CNS [phone]

## 2017-12-29 NOTE — Plan of Care (Signed)
  Problem: Education: Goal: Knowledge of General Education information will improve Description Including pain rating scale, medication(s)/side effects and non-pharmacologic comfort measures Outcome: Progressing   Problem: Nutrition: Goal: Adequate nutrition will be maintained Outcome: Progressing   Problem: Elimination: Goal: Will not experience complications related to bowel motility Outcome: Progressing Goal: Will not experience complications related to urinary retention Outcome: Progressing   Problem: Pain Managment: Goal: General experience of comfort will improve Outcome: Progressing   Problem: Skin Integrity: Goal: Risk for impaired skin integrity will decrease Outcome: Progressing   Problem: Health Behavior/Discharge Planning: Goal: Ability to manage health-related needs will improve Outcome: Not Progressing   Problem: Clinical Measurements: Goal: Ability to maintain clinical measurements within normal limits will improve Outcome: Not Progressing Goal: Will remain free from infection Outcome: Not Progressing Goal: Diagnostic test results will improve Outcome: Not Progressing Goal: Respiratory complications will improve Outcome: Not Progressing   Problem: Activity: Goal: Risk for activity intolerance will decrease Outcome: Not Progressing   Problem: Coping: Goal: Level of anxiety will decrease Outcome: Not Progressing   Problem: Safety: Goal: Ability to remain free from injury will improve Outcome: Not Progressing   Problem: Activity: Goal: Ability to tolerate increased activity will improve Outcome: Not Progressing   Problem: Respiratory: Goal: Ability to maintain a clear airway and adequate ventilation will improve Outcome: Not Progressing   Problem: Role Relationship: Goal: Method of communication will improve Outcome: Not Progressing   Problem: Health Behavior/Discharge Planning: Goal: Ability to manage health-related needs will  improve Outcome: Not Progressing   Problem: Clinical Measurements: Goal: Ability to maintain clinical measurements within normal limits will improve Outcome: Not Progressing Goal: Will remain free from infection Outcome: Not Progressing Goal: Diagnostic test results will improve Outcome: Not Progressing Goal: Respiratory complications will improve Outcome: Not Progressing   Problem: Activity: Goal: Risk for activity intolerance will decrease Outcome: Not Progressing   Problem: Coping: Goal: Level of anxiety will decrease Outcome: Not Progressing   Problem: Safety: Goal: Ability to remain free from injury will improve Outcome: Not Progressing   Problem: Activity: Goal: Ability to tolerate increased activity will improve Outcome: Not Progressing   Problem: Respiratory: Goal: Ability to maintain a clear airway and adequate ventilation will improve Outcome: Not Progressing   Problem: Role Relationship: Goal: Method of communication will improve Outcome: Not Progressing

## 2017-12-29 NOTE — Progress Notes (Addendum)
PULMONARY / CRITICAL CARE MEDICINE   Name: Laura Valentine MRN: [id number] DOB: [birth year]    ADMISSION DATE:  12/22/2017 CONSULTATION DATE:  12/23/2017  REFERRING MD:  Philis Pique  CHIEF COMPLAINT:  Cardiac arrest  HISTORY OF PRESENT ILLNESS:   42 y/o female with asthma [facility] in the setting of rising blood pressures.  She underwent a C-section that evening and received blood pressure medications and magnesium sulfate.  By 02947on 8/2 she had a cardiac arrest.  She described trouble breathing prior and was then found unresponsive and pulseless.  CPR was performed and the patient received a shock during the code.  [facility]    STUDIES:  8/2 EEG > burst suppression pattern 8/2 CT angiogram chest> no pe, extensive bilateral airspace disease either due to aspiration or pulmonary edema 8/2 CT abdomen> expected findings post c-section 8/2 CT head > negative, some paranasal sinus disease 8/2 non contrast MRI brain within normal limits, no evidence of anoxia, no findings of PRES, MRV showed no sinovenous occlusive disease 8/2 Echo> LVEF normal 8/5 MRI brain >> stable appearance, no evidence of anoxic injury; progression of sinusitis progressed from 3 days ago 8/5 LTM showing frequent GPED, now with some evolution 8/6 AM burst suppression on LTM on versed 70 mg/hr/ propofol 150 mcg/hr  CULTURES: 8/2 trach asp >> norma flora 8/2 blood > ngtd day 4 8/3 MRSA PCR >> neg  ANTIBIOTICS: 8/2 vanc > 8/4 8/2 cefepime> 8/3 8/2 flagyl > 8/3 8/3 aztreonam >  SIGNIFICANT EVENTS: 8/1 elective C-section in setting of hypertension 38 weeks, seizure 8/2 cardiac arrest 8/5 neg MRI, ongoing SE per LTM  8/6 Burst suppression on LTM  LINES/TUBES: 8/2 ETT >> 12/27/2017 replaced with 7.5 on 12/27/2017>> 8/2 OGT >> 8/2 L IJ CVL >> 8/2 Foley   SUBJECTIVE:  Remains critically ill, sedated , on c  EEG Afebrile Hypoxic this am requiring increase FIO2  VITAL SIGNS: BP 131/74 (BP Location: Right Arm)   Pulse (!) 105   Temp 99.9 F (37.7 C) (Rectal)   Resp (!) 27   Ht _0  (1.6 m)   Wt (!) 136.7 kg   LMP 03/29/2017   SpO2 93%   Breastfeeding? Unknown   BMI 53.39 kg/m   HEMODYNAMICS: CVP:  [7 mmHg-14 mmHg] 11 mmHg  VENTILATOR SETTINGS: Vent Mode: PRVC FiO2 (%):  [40 %-70 %] 70 % Set Rate:  [20 bmp] 20 bmp Vt Set:  [520 mL] 520 mL PEEP:  [5 cmH20-10 cmH20] 10 cmH20 Plateau Pressure:  [6 cmH20-19 cmH20] 11 cmH20  INTAKE / OUTPUT: I/O last 3 completed shifts: In: 22532.4 [I.V.:17608.9; NG/GT:2265; IV Piggyback:2658.5] Out: 46546[Urine:4025]  PHYSICAL EXAMINATION: Gen.obese, in no distress, orally intubated, sedated ENT - large tongue, pupils [id number] Neck: No JVD, no thyromegaly, no carotid bruits Lungs: no use of accessory muscles, no dullness to percussion, decreased without rales or rhonchi  Cardiovascular: Rhythm regular, heart sounds  normal, no murmurs or gallops, 2+ peripheral edema Abdomen: soft and non-tender, no hepatosplenomegaly, BS normal. Musculoskeletal: No deformities, no cyanosis or clubbing Neuro:  RASS-5, non focal     LABS:  BMET Recent Labs  Lab 12/27/17 0503 12/28/17 0445 12/29/17 0432  NA 142 149* 150*  K 3.1* 3.0* 4.3  CL 104 117* 124*  CO2 _1 BUN _2 CREATININE 0.60 0.49 0.55  GLUCOSE 97 88 82  Electrolytes Recent Labs  Lab 12/27/17 0503  12/28/17 0445 12/28/17 1658 12/29/17 0432  CALCIUM 7.4*  --  7.0*  --  7.6*  MG 1.7   < > 1.9 1.8 1.9  PHOS 4.1   < > 3.6 3.0 2.5   < > = values in this interval not displayed.    CBC Recent Labs  Lab 12/28/17 0445 12/28/17 0635 12/28/17 1223 12/29/17 0747  WBC 7.2  --  7.0 11.3*  HGB 6.7*  --  6.7* 7.7*  HCT 23.0*  --  23.0* 25.6*  PLT 167 168 178 193    Coag's Recent Labs  Lab 12/28/17 0635 12/29/17 0432  APTT 30 31  INR 1.12 1.11    Sepsis  Markers Recent Labs  Lab 12/23/17 0903 12/23/17 1407 12/27/17 1023 12/28/17 0445 12/29/17 0432  LATICACIDVEN 2.3* 1.7  --   --   --   PROCALCITON  --   --  0.23 0.15 0.12    ABG Recent Labs  Lab 12/27/17 0338 12/28/17 0309 12/29/17 0445  PHART 7.433 7.357 7.365  PCO2ART 43.2 44.1 38.7  PO2ART 148.0* 175.0* 59.6*    Liver Enzymes Recent Labs  Lab 12/23/17 0903 12/23/17 2242 12/25/17 0436  AST 77* 62* 42*  ALT 55* 39 26  ALKPHOS 145* 112 94  BILITOT 0.5 0.6 0.5  ALBUMIN 2.1* 2.0* 1.7*    Cardiac Enzymes Recent Labs  Lab 12/23/17 0600 12/23/17 0903  TROPONINI 0.03* 0.08*    Glucose Recent Labs  Lab 12/28/17 1138 12/28/17 1528 12/28/17 1914 12/28/17 2322 12/29/17 0339 12/29/17 0740  GLUCAP 93 77 81 91 82 81    Imaging No results found.   DISCUSSION: 42 y/o female with cardiac arrest after elective c-section for pre-eclampsia.  Given hypertension, frothy edema from ETT, bilateral infiltrates, this is most likely acute pulmonary edema vs aspiration.  MRI 8/5 showing no evidence of anoxic or injury.    Required  versed 70 mg/hr and propofol 150 mcg/kg/ min; For  burst suppression , then changed to ketamine, versed now tapered off   ASSESSMENT / PLAN:  PULMONARY A: Acute respiratory failure with hypoxemia Acute pulmonary edema Bilateral pleural effusions Possible aspiration pneumonia Left lower lobe mucus plugging s/p fiberoptic bronchoscopy 12/27/2017 Tube change 12/27/2017   P:   Vent bundle No SBTs Pulmonary toilet Add PEEP 10   CARDIOVASCULAR A:  Hypertension >resolved Hypotension in setting of high sedation, UOP remains adequate, no evidence of end organ damage P:  Monitor cvp Taper off neo gtt  RENAL Lab Results  Component Value Date   CREATININE 0.55 12/29/2017   CREATININE 0.49 12/28/2017   CREATININE 0.60 12/27/2017   Recent Labs  Lab 12/27/17 0503 12/28/17 0445 12/29/17 0432  K 3.1* 3.0* 4.3    A:    Hypernatremia Volume overload   P:   Start lasix 40 q 12h & titrate based on response  GASTROINTESTINAL A:   Mild shock liver > resolved Ileus-  BM 8/7 P:   Ct TFs at goal  HEMATOLOGIC Recent Labs    12/28/17 1223 12/29/17 0747  HGB 6.7* 7.7*    A:   Anemia- currently stable, possible dilutional - no evidence of bleeding P:  Transfuse per protocol for hemoglobin less than 7, improved appropriately with 1 U PRBC Monitor vaginal bleeding noted on 12/28/2017 to be scant  INFECTIOUS A:   Aspiration pneumonia given bilateral infiltrates on CXR and rising temp, however WBC remains normal  P:   Continue ceftx  until 8/10, unless tongue swelling appears worse BAL cx ng  ENDOCRINE CBG (last 3)  Recent Labs    12/28/17 2322 12/29/17 0339 12/29/17 0740  GLUCAP 91 82 81    A:   No acute issues P:   Sliding scale insulin protocol  NEUROLOGIC A:   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 Depakote, keppra & phenobarb per neuro Burst suppression per neuro, versed off, ct ketamine     FAMILY  - Updates: 8/8  - Inter-disciplinary family meet or Palliative Care meeting due by: 8/10   The patient is critically ill with multiple organ systems failure and requires high complexity decision making for assessment and support, frequent evaluation and titration of therapies, application of advanced monitoring technologies and extensive interpretation of multiple databases. Critical Care Time devoted to patient care services described in this note independent of APP/resident  time is 35 minutes.   Kara Mead MD. Shade Flood. [facility] Pager [phone] If no response call [phone]      12/29/2017, 8:41 AM

## 2017-12-29 NOTE — Progress Notes (Signed)
Nutrition Follow-up  DOCUMENTATION CODES:   Obesity unspecified  INTERVENTION:   Tube Feeding:  Change to Vital High Protein @ 65 ml/hr D/C Pro-Stat 30 mL daily Provides 1560 kcals, 137 g of protein and 1310 mL of free water Meets 100% estimated needs   NUTRITION DIAGNOSIS:   Inadequate oral intake related to acute illness as evidenced by NPO status.  Being addressed via nutrition support  GOAL:   Patient will meet greater than or equal to 90% of their needs  Progressing  MONITOR:   Vent status, TF tolerance, Weight trends, Labs, I & O's  REASON FOR ASSESSMENT:   Consult Enteral/tube feeding initiation and management  ASSESSMENT:   42 [facility] with severe preeclampsia and underwent C-section at 38 weeks and 2 days. Few hours post-op pt complained of difficulty breathing with subsequent cardiac arrest. [facility] Pt with acute respiratory failure requiring  vent support. PMH of asthma, endometreiosis.   8/1 C-section in setting of HTN 8/2 Cardiac arrest, Seizures 8/5 MRI without evidenced of anoxic injury 8/6 Burst suppression on LTM 8/6 Bronch  Pt remains on vent support, off pressors at present Continues EEG monitoring, Burst suppression, on ketamine (weaning)  Tolerating Vital AF 1.2 @ 55 ml/hr with Pro-Stat 30 mL daily via OG tube  Anemia with vaginal bleeding  +stool, rectal tube inserted today; BS faint, abdomen soft/obese, no vomitting  Weight up today from admission; net +26 L per I/O flow sheet; good UOP  Labs: sodium 150 Meds: lasix  Diet Order:   Diet Order            Diet NPO time specified  Diet effective now              EDUCATION NEEDS:   Not appropriate for education at this time  Skin:  Skin Assessment: Skin Integrity Issues: Skin Integrity Issues:: Incisions Incisions: abdominal and perineum (8/1, C-section)  Last BM:  8/7  Height:   Ht  Readings from Last 1 Encounters:  12/22/17 5\' 3"  (1.6 m)    Weight:   Wt Readings from Last 1 Encounters:  12/29/17 (!) 136.7 kg    Ideal Body Weight:  52.3 kg  BMI:  Body mass index is 53.39 kg/m.  Estimated Nutritional Needs:   Kcal:  1300-1665 kcals  Protein:  105-132 g  Fluid:  >/= 1.8 L   Romelle Starcherate Hemi Chacko MS, RD, LDN, CNSC [phone] Pager  [phone] Weekend/On-Call Pager

## 2017-12-29 NOTE — Procedures (Signed)
LTM-EEG Report  HISTORY: Continuous video-EEG monitoring performed for[age]year old withanoxia, myoclonic status. ACQUISITION: International 10-20 system for electrode placement; 18 channels with additional eyes linked to ipsilateral ears and EKG. Additional T1-T2 electrodes were used. Continuous video recording obtained.   EEG NUMBER:  MEDICATIONS:  Day4:propofol, MDZ, VPA, LEV Day 5: propofol, MDZ, VPA, LEV Day 6: propofol, MDZ, VPA, LEV  DAY #4: from 0730 12/26/17 to 0730 12/27/17 BACKGROUND: An overalllowvoltagediscontinuous recording withpoorspontaneous variability andabsentreactivity. The background consisted of low voltage background suppression with superimposed medium voltage spikes as below. Spiking improved with increased sedation, leading to a burst-suppression pattern through the late afternoon and evening with periods of suppression lasting 5-10 seconds and bursts of polyspikes bilaterally. EPILEPTIFORM/PERIODIC ACTIVITY:There were initially frequent/continuous polyspikes bilaterally up to 5hz  with clinical correlate of myoclonic jerking of the body. The spikes became less abundant with increased sedation through the afternoon and evening. SEIZURES:Polyspikes with myoclonic seizures, resolved with increased sedation EVENTS:Myoclonic jerking as above  DAY #5: from 0730 12/27/17 to 0730 12/28/17 BACKGROUND: An overalllowvoltagediscontinuous recording withpoorspontaneous variability andabsentreactivity. The background consisted of low voltage background suppression with superimposed medium voltage spikes as below. Burst-suppression transiently resolved in the early afternoon with increase in spiking, later going back into burst-suppression in the evening.  EPILEPTIFORM/PERIODIC ACTIVITY:There were polyspikes diffusely in bursts which became more continuous with tapering of sedation.  SEIZURES: no definite ictal evolution to spiking EVENTS:none  DAY #6: from 0730  12/28/17 to 0730 12/29/17 BACKGROUND: An overalllowvoltagediscontinuous recording withpoorspontaneous variability andabsentreactivity. The background consisted of low voltage background suppression with occasional bursts of medium voltage spikes as below. Burst-suppression was sustained throughout the recording with some increase in suppression overnight with less frequent bursts.  EPILEPTIFORM/PERIODIC ACTIVITY:Some generalized spikes during bursts SEIZURES: none EVENTS:none  EKG: no significant arrhythmia  SUMMARY: This was a markedly abnormal continuous video EEG due to a burst-suppression pattern and superimposed diffuse spikes. Reactivity was absent. This was indicative of an anoxic injury pattern. Correlation with brain imaging and examination recommended for further prognostication.

## 2017-12-30 ENCOUNTER — Inpatient Hospital Stay (HOSPITAL_COMMUNITY): Payer: Managed Care, Other (non HMO)

## 2017-12-30 LAB — GLUCOSE, CAPILLARY
GLUCOSE-CAPILLARY: 115 mg/dL — AB (ref 70–99)
GLUCOSE-CAPILLARY: 118 mg/dL — AB (ref 70–99)
GLUCOSE-CAPILLARY: 119 mg/dL — AB (ref 70–99)
Glucose-Capillary: 122 mg/dL — ABNORMAL HIGH (ref 70–99)
Glucose-Capillary: 119 mg/dL — ABNORMAL HIGH (ref 70–99)
Glucose-Capillary: 126 mg/dL — ABNORMAL HIGH (ref 70–99)

## 2017-12-30 LAB — BASIC METABOLIC PANEL
Anion gap: 8 (ref 5–15)
BUN: 16 mg/dL (ref 6–20)
CO2: 27 mmol/L (ref 22–32)
CREATININE: 0.51 mg/dL (ref 0.44–1.00)
Calcium: 7.8 mg/dL — ABNORMAL LOW (ref 8.9–10.3)
Chloride: 112 mmol/L — ABNORMAL HIGH (ref 98–111)
GFR calc Af Amer: >60 mL/min (ref >60)
GFR calc non Af Amer: >60 mL/min (ref >60)
GLUCOSE: 118 mg/dL — AB (ref 70–99)
Potassium: 3 mmol/L — ABNORMAL LOW (ref 3.5–5.1)
Sodium: 147 mmol/L — ABNORMAL HIGH (ref 135–145)

## 2017-12-30 LAB — CULTURE, BAL-QUANTITATIVE: SPECIAL REQUESTS: NORMAL

## 2017-12-30 LAB — PHOSPHORUS
Phosphorus: 2 mg/dL — ABNORMAL LOW (ref 2.5–4.6)
Phosphorus: 2.7 mg/dL (ref 2.5–4.6)

## 2017-12-30 LAB — CBC
HEMATOCRIT: 26.3 % — AB (ref 36.0–46.0)
HEMOGLOBIN: 7.9 g/dL — AB (ref 12.0–15.0)
MCH: 25.2 pg — AB (ref 26.0–34.0)
MCHC: 30 g/dL (ref 30.0–36.0)
MCV: 83.8 fL (ref 78.0–100.0)
Platelets: 218 10*3/uL (ref 150–400)
RBC: 3.14 MIL/uL — ABNORMAL LOW (ref 3.87–5.11)
RDW: 16.4 % — ABNORMAL HIGH (ref 11.5–15.5)
WBC: 9.7 10*3/uL (ref 4.0–10.5)

## 2017-12-30 LAB — VALPROIC ACID LEVEL: Valproic Acid Lvl: 38 ug/mL — ABNORMAL LOW (ref 50.0–100.0)

## 2017-12-30 LAB — MAGNESIUM
MAGNESIUM: 1.9 mg/dL (ref 1.7–2.4)
Magnesium: 1.8 mg/dL (ref 1.7–2.4)

## 2017-12-30 LAB — CULTURE, BAL-QUANTITATIVE W GRAM STAIN: Culture: 3000 — AB

## 2017-12-30 MED ORDER — POTASSIUM PHOSPHATES 15 MMOLE/5ML IV SOLN
30.0000 mmol | Freq: Once | INTRAVENOUS | Status: AC
  Administered 2017-12-30: 30 mmol via INTRAVENOUS
  Filled 2017-12-30: qty 10

## 2017-12-30 MED ORDER — VALPROATE SODIUM 100 MG/ML IV SOLN: 1000.0000 mg | Freq: Once | INTRAVENOUS | Status: DC

## 2017-12-30 MED ORDER — POTASSIUM CHLORIDE 20 MEQ/15ML (10%) PO SOLN
40.0000 meq | Freq: Once | ORAL | Status: AC
  Administered 2017-12-30: 40 meq
  Filled 2017-12-30: qty 30

## 2017-12-30 MED ORDER — PENTOBARBITAL LOAD VIA INFUSION
5.0000 mg/kg | Freq: Once | INTRAVENOUS | Status: AC
  Administered 2017-12-30: 651 mg via INTRAVENOUS
  Filled 2017-12-30: qty 300

## 2017-12-30 MED ORDER — VALPROATE SODIUM 500 MG/5ML IV SOLN
1000.0000 mg | Freq: Once | INTRAVENOUS | Status: AC
  Administered 2017-12-30: 1000 mg via INTRAVENOUS
  Filled 2017-12-30: qty 10

## 2017-12-30 MED ORDER — PENTOBARBITAL SODIUM 50 MG/ML IJ SOLN
3.0000 mg/kg/h | INTRAVENOUS | Status: DC
  Administered 2017-12-30: 1 mg/kg/h via INTRAVENOUS
  Administered 2017-12-30: 3 mg/kg/h via INTRAVENOUS
  Administered 2017-12-31: 4 mg/kg/h via INTRAVENOUS
  Administered 2017-12-31: 3.5 mg/kg/h via INTRAVENOUS
  Administered 2017-12-31: 4 mg/kg/h via INTRAVENOUS
  Administered 2018-01-01: 3 mg/kg/h via INTRAVENOUS
  Administered 2018-01-01: 1.5 mg/kg/h via INTRAVENOUS
  Administered 2018-01-01: 3 mg/kg/h via INTRAVENOUS
  Filled 2017-12-30 (×8): qty 50

## 2017-12-30 MED ORDER — NOREPINEPHRINE 4 MG/250ML-% IV SOLN
0.0000 ug/min | INTRAVENOUS | Status: DC
  Filled 2017-12-30: qty 250

## 2017-12-30 MED ORDER — ACETAMINOPHEN 10 MG/ML IV SOLN
1000.0000 mg | Freq: Once | INTRAVENOUS | Status: AC
  Administered 2017-12-31: 1000 mg via INTRAVENOUS
  Filled 2017-12-30: qty 100

## 2017-12-30 MED ORDER — PENTOBARBITAL LOAD VIA INFUSION
10.0000 mg/kg | Freq: Once | INTRAVENOUS | Status: AC
  Administered 2017-12-30: 1302 mg via INTRAVENOUS

## 2017-12-30 NOTE — Progress Notes (Addendum)
Subjective: Seizures resumed   Exam: Vitals:   12/30/17 0930 12/30/17 1000  BP: (!) 156/80 (!) 155/78  Pulse: 96 100  Resp: (!) 36 (!) 31  Temp:    SpO2: 100% 98%   Gen: In bed, intubated Resp: ventilated Abd: soft, nt  Neuro: MS: Does not open eyes or follow commands. HQ:IONGECN:PERRL, corneals intact,  Motor: no response  VPA level 37  Impression: 42 yo F with anoxic brain injury s/p cardiac arrest. Her suppressed background with GPEDs and burst suppression initially are strongly suggestive of poor prognosis.   That being said, with her young age and negative imaging, we pursued 48 hours of burst suppression.   If myoclonic status were to resume then I do not think that I would favor further suppression.   Due to increasing triglycerides, she was changed from versed/propfol  to ketamine/versed. She is currently on 4 AEDs.   She had a low depakote level this am, but did not receive all 4 doses yesterday due to technical issues.   Due to continued minimal changes on MRI, will pursue suppression with barbiturate. I discussed with the family that if she stops seizing, she will need trach/peg with any improvement occurring over months. If she does start seizing again, I would not have any other therapies to offer.    Recommendations: 1) depakote 1750mg  QID, additional 1g now 2) continue phenobarb 65mg  daily.  3) continue keppra 1500mg  BID 4) continue vimpat 150mg  BID  This patient is critically ill and at significant risk of neurological worsening, death and care requires constant monitoring of vital signs, hemodynamics,respiratory and cardiac monitoring, neurological assessment, discussion with family, other specialists and medical decision making of high complexity. I spent 45 minutes of neurocritical care time  in the care of  this patient.  Ritta SlotMcNeill Coady Train, MD [facility] [phone]  If 7pm- 7am, please page neurology on call as listed in AMION. 12/30/2017   10:15 AM

## 2017-12-30 NOTE — Progress Notes (Signed)
42 year old woman G3, P1, [facility] with [facility] and underwent C-section.  Unfortunately sustained cardiac arrest around 5 AM on 8/2 in the setting of hypertension and sudden onset dyspnea. She developed status epilepticus postarrest, initially required high-dose Versed and propofol for burst suppression.  Due to high triglycerides, propofol was discontinued and ketamine used.  Interim- These medications were stopped on 8/8 and she developed myoclonic activity around 7 PM requiring 4 mg of Ativan and restarting ketamine  She diuresed well with Lasix. Low-grade febrile, sedated on ketamine and Precedex, oxygenation improved with saturation 99% on 60%/PEEP of 10, decreased breath sounds bilateral, anasarca, S1-S2 normal, pupils 2 mm bilaterally equally reactive to light, enlarged tongue  Labs show hypokalemia and hypophosphatemia, normal WBC count. Chest x-ray shows improving bibasilar infiltrates and effusions  Impression/plan Status epilepticus-continue with ketamine, neurology considering adding pentobarbital She remains on Depakote, Keppra and phenobarbital and Vimpat  Concern for anoxic brain injury-long-term EEG pattern strongly suggestive of poor prognosis for meaningful neurologic recovery.  I communicated this to her husband.  Given her negative imaging, neurology would like to repeat MRI and I think this is reasonable.  Acute respiratory failure-drop FiO2 to 50% and PEEP to 8. Continue aggressive diuresis with Lasix 40 every 12 and replace potassium.  Bibasilar pneumonia likely aspiration-has received antibiotics for 8 days with normal leukocytosis and normalized procalcitonin, will discontinue ceftriaxone especially in the light of tongue swelling.  My critical care time x 35 m  Cyril Mourningakesh Jailani Hogans MD. FCCP. [facility] Pager [phone] If no response call [phone]   12/30/2017

## 2017-12-30 NOTE — Progress Notes (Signed)
Patient transported to MRI and back to 3M04 without complications with RN. Will continue to monitor.

## 2017-12-30 NOTE — Procedures (Signed)
LTM-EEG Report  HISTORY: Continuous video-EEG monitoring performed for[age]year old withanoxia, myoclonic status. ACQUISITION: International 10-20 system for electrode placement; 18 channels with additional eyes linked to ipsilateral ears and EKG. Additional T1-T2 electrodes were used. Continuous video recording obtained.   EEG NUMBER:  MEDICATIONS:  Day4:propofol, MDZ, VPA, LEV Day 5: propofol, MDZ, VPA, LEV Day 6: propofol, MDZ, VPA, LEV Day 7: see EMR  DAY #4: from 0730 12/26/17 to 0730 12/27/17 BACKGROUND: An overalllowvoltagediscontinuous recording withpoorspontaneous variability andabsentreactivity. The background consisted of low voltage background suppression with superimposed medium voltage spikes as below. Spiking improved with increased sedation, leading to a burst-suppression pattern through the late afternoon and evening with periods of suppression lasting 5-10 seconds and bursts of polyspikes bilaterally. EPILEPTIFORM/PERIODIC ACTIVITY:There were initially frequent/continuous polyspikes bilaterally up to 5hz  with clinical correlate of myoclonic jerking of the body. The spikes became less abundant with increased sedation through the afternoon and evening. SEIZURES:Polyspikes with myoclonic seizures, resolved with increased sedation EVENTS:Myoclonic jerking as above  DAY #5: from 0730 12/27/17 to 0730 12/28/17 BACKGROUND: An overalllowvoltagediscontinuous recording withpoorspontaneous variability andabsentreactivity. The background consisted of low voltage background suppression with superimposed medium voltage spikes as below.Burst-suppression transiently resolved in the early afternoon with increase in spiking, later going back into burst-suppression in the evening. EPILEPTIFORM/PERIODIC ACTIVITY:There werepolyspikes diffusely in bursts which became more continuous with tapering of sedation. SEIZURES:no definite ictal evolution to  spiking EVENTS:none  DAY #6: from 0730 12/28/17 to 0730 12/29/17 BACKGROUND: An overalllowvoltagediscontinuous recording withpoorspontaneous variability andabsentreactivity. The background consisted of low voltage background suppression with occasional bursts of medium voltage spikes as below.Burst-suppression was sustained throughout the recording with some increase in suppression overnight with less frequent bursts.  EPILEPTIFORM/PERIODIC ACTIVITY:Some generalized spikes during bursts SEIZURES:none EVENTS:none  DAY #7: from 0730 12/29/17 to 0730 12/30/17 BACKGROUND: An overalllowvoltagediscontinuous recording withpoorspontaneous variability andabsentreactivity. The background initially consisted of low voltage background suppression with occasional bursts of medium voltage spikes as below.Through the afternoon and evening, there was return of diffuse, rapid polyspikes with suppressed background in between.  EPILEPTIFORM/PERIODIC ACTIVITY:Diffuse, rapid polyspikes without clinical signs. These were increased with sedation tapering. SEIZURES:none EVENTS:none  EKG: no significant arrhythmia  SUMMARY: This was a markedly abnormal continuous video EEG due to a suppressed background with superimposed rapid polyspikes and absent reactivity. This was indicative of an anoxic injury pattern. Correlation with brain imaging and examination recommended for further prognostication.

## 2017-12-30 NOTE — Progress Notes (Signed)
LTM EEG checked, no skin breakdown noted. Paste added to Fz, Cz, Pz, F3, C3, P3, F4, C4, P4, A1 and A2

## 2017-12-31 ENCOUNTER — Inpatient Hospital Stay (HOSPITAL_COMMUNITY): Payer: Managed Care, Other (non HMO)

## 2017-12-31 LAB — CBC
HCT: 25.2 % — ABNORMAL LOW (ref 36.0–46.0)
Hemoglobin: 7.8 g/dL — ABNORMAL LOW (ref 12.0–15.0)
MCH: 25.6 pg — ABNORMAL LOW (ref 26.0–34.0)
MCHC: 31 g/dL (ref 30.0–36.0)
MCV: 82.6 fL (ref 78.0–100.0)
Platelets: 175 10*3/uL (ref 150–400)
RBC: 3.05 MIL/uL — ABNORMAL LOW (ref 3.87–5.11)
RDW: 16.7 % — ABNORMAL HIGH (ref 11.5–15.5)
WBC: 8.7 10*3/uL (ref 4.0–10.5)

## 2017-12-31 LAB — BASIC METABOLIC PANEL
Anion gap: 9 (ref 5–15)
BUN: 15 mg/dL (ref 6–20)
CALCIUM: 7.4 mg/dL — AB (ref 8.9–10.3)
CO2: 28 mmol/L (ref 22–32)
CREATININE: 0.54 mg/dL (ref 0.44–1.00)
Chloride: 106 mmol/L (ref 98–111)
GFR calc Af Amer: >60 mL/min (ref >60)
GFR calc non Af Amer: >60 mL/min (ref >60)
GLUCOSE: 106 mg/dL — AB (ref 70–99)
Potassium: 2.9 mmol/L — ABNORMAL LOW (ref 3.5–5.1)
Sodium: 143 mmol/L (ref 135–145)

## 2017-12-31 LAB — VALPROIC ACID LEVEL: Valproic Acid Lvl: 44 ug/mL — ABNORMAL LOW (ref 50.0–100.0)

## 2017-12-31 LAB — GLUCOSE, CAPILLARY
GLUCOSE-CAPILLARY: 104 mg/dL — AB (ref 70–99)
GLUCOSE-CAPILLARY: 100 mg/dL — AB (ref 70–99)
GLUCOSE-CAPILLARY: 95 mg/dL (ref 70–99)
Glucose-Capillary: 102 mg/dL — ABNORMAL HIGH (ref 70–99)
Glucose-Capillary: 94 mg/dL (ref 70–99)
Glucose-Capillary: 95 mg/dL (ref 70–99)

## 2017-12-31 MED ORDER — VALPROATE SODIUM 500 MG/5ML IV SOLN
2000.0000 mg | Freq: Four times a day (QID) | INTRAVENOUS | Status: DC
  Administered 2017-12-31 – 2018-01-01 (×4): 2000 mg via INTRAVENOUS
  Filled 2017-12-31 (×5): qty 20

## 2017-12-31 MED ORDER — POTASSIUM CHLORIDE 20 MEQ PO PACK
40.0000 meq | PACK | Freq: Once | ORAL | Status: AC
  Administered 2017-12-31: 40 meq
  Filled 2017-12-31: qty 2

## 2017-12-31 MED ORDER — ACETAMINOPHEN 10 MG/ML IV SOLN
1000.0000 mg | Freq: Four times a day (QID) | INTRAVENOUS | Status: AC
  Administered 2018-01-01 (×4): 1000 mg via INTRAVENOUS
  Filled 2017-12-31 (×4): qty 100

## 2017-12-31 MED ORDER — POTASSIUM CHLORIDE 10 MEQ/50ML IV SOLN
10.0000 meq | INTRAVENOUS | Status: AC
  Administered 2017-12-31 (×6): 10 meq via INTRAVENOUS
  Filled 2017-12-31 (×6): qty 50

## 2017-12-31 NOTE — Progress Notes (Signed)
42 year old woman G3, P1, [facility] with preeclampsia and underwent C-section.  Unfortunately sustained cardiac arrest around 5 AM on 8/2 in the setting of hypertension and sudden onset dyspnea. She developed status epilepticus postarrest, initially required high-dose Versed and propofol for burst suppression.  Due to high triglycerides, propofol was discontinued and ketamine used. 8/8 developed myoclonic seizures when Versed and ketamine was stopped.  Interim-started on pentobarbital yesterday and could have been tapered to off.  She continues to diurese well with Lasix. Remains critically ill, comatose on pentobarbital at 4, pupils 3 mm bilaterally equally reactive to light, saturations 97% on 50%/PEEP of 8, S1-S2 normal, decreased breath sounds bilateral, anasarca slightly decreased, tongue remains swollen.  Labs show hypokalemia which is being repleted, normal WBC count and slight drop in platelets. Chest x-ray personally reviewed which shows bibasal airspace disease has improved on the left  Impression/plan  Acute respiratory failure-can drop PEEP to 5 Continue diuresis with Lasix 40 every 12 for volume overload, hypokalemia will be repleted.  Status epilepticus-now on pentobarbital coma. 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  She has tongue swelling which is likely related to tongue bite rather than anaphylaxis  My independent critical care time  X 2926m  Rakesh V. Vassie LollAlva MD

## 2017-12-31 NOTE — Progress Notes (Signed)
Late entry for OB  I came by to see the pt and spoke to the family.  The abdomen is soft.  I removed the honeycomb dressing and inspected the incision- it is healing well.  The steristrips remain on and should be removed when they are starting to peel or after a week.  I also examine the perineum and groin area- there is some redness but no real excoriation and the nurses are keeping salve on the the area to help.  Laura Valentine A

## 2017-12-31 NOTE — Progress Notes (Signed)
Subjective: Burst suppression obtained with pentobarbital.   Exam: Vitals:   12/31/17 0755 12/31/17 0800  BP: (!) 105/57 (!) 111/55  Pulse:  (!) 103  Resp: (!) 23 (!) 25  Temp:    SpO2:  97%   Gen: In bed, intubated Resp: ventilated Abd: soft, nt  Neuro: MS: Does not open eyes or follow commands. ZO:XWRUECN:PERRL, corneals intact,  Motor: no response  VPA level 44  Impression: 42 yo F with anoxic brain injury s/p cardiac arrest. Her suppressed background with GPEDs and burst suppression initially are strongly suggestive of poor prognosis.   That being said, with her young age and negative imaging, we pursued 48 hours of burst suppression. Following this, she resumed myoclonic seizures despite 4 AEDs.   Due to continued minimal changes on MRI, we have then pursued suppression with pentobarbital.  Recommendations: 1) increase depakote to 2g QID 2) continue keppra 1500mg  BID 3) continue vimpat 150mg  BID 4) continue pentobarb through tomorrow.   This patient is critically ill and at significant risk of neurological worsening, death and care requires constant monitoring of vital signs, hemodynamics,respiratory and cardiac monitoring, neurological assessment, discussion with family, other specialists and medical decision making of high complexity. I spent 45 minutes of neurocritical care time  in the care of  this patient.  Ritta SlotMcNeill Yoland Scherr, MD [facility] [phone]  If 7pm- 7am, please page neurology on call as listed in AMION. 12/31/2017  8:34 AM

## 2017-12-31 NOTE — Progress Notes (Signed)
Pentobarbital rate changed to 3.5mg /kg/hr per verbal order by Dr. Amada JupiterKirkpatrick.   Margot AblesWhitney Gillon, RN

## 2017-12-31 NOTE — Progress Notes (Addendum)
Per instruction by Dr Amada JupiterKirkpatrick-  Pentobarbital paused at 1802. Will restart at 1822 at 3mg /kg/hr.  Margot AblesWhitney Gillon, RN

## 2017-12-31 NOTE — Progress Notes (Signed)
Patient had heavy breathing last night- correlating with electrographic seizures.  Bolused with 5 mg/kg pentobarbital and increased rate to 3 mg/kg/hr.  Patient back in burst suppression-we reviewed EEG at 3 AM, first occurring every 5 seconds and therefore increase titration to 4 mg/kg per hour.

## 2017-12-31 NOTE — Procedures (Signed)
LTM-EEG Report  HISTORY: Continuous video-EEG monitoring performed for[age]year old withanoxia, myoclonic status. ACQUISITION: International 10-20 system for electrode placement; 18 channels with additional eyes linked to ipsilateral ears and EKG. Additional T1-T2 electrodes were used. Continuous video recording obtained.   EEG NUMBER:  MEDICATIONS:  Day 8: see EMR  DAY #8: from 0730 12/30/17 to 0730 12/31/17 BACKGROUND: An overall low voltage, burst-suppression recording withpoorspontaneous variability andabsentreactivity. The background consisted of low voltage burst-suppression attained around 2300 with periods of suppression lasting 10-20 seconds and bursts of polyspikes lasting 0.5-1 second.  EPILEPTIFORM/PERIODIC ACTIVITY:Diffuse, rapid polyspikes initially, later becoming a burst-suppression pattern with increased sedation SEIZURES: Rapid polyspikes as above initially, resolving with sedation increase. Some reported myoclonic jerking with spikes. EVENTS:none  EKG: no significant arrhythmia  SUMMARY: This was a markedly abnormal continuous video EEG due toa burst-suppression pattern with superimposed rapid polyspikes and absent reactivity. This was indicative ofa severe, diffuse cerebral disturbance. Burst-suppression was attained around 2300 on the night of 12/30/17.

## 2017-12-31 NOTE — Progress Notes (Addendum)
[place] [facility] Progress Note  Female G3, P1, he was admitted to [facility] 12/22/2017 with preeclampsia and underwent C-section with viable fetus.  She subsequently developed cardiac arrest while on the floor on 12/23/2017 in the setting of hypertension. She is transferred to [facility] and on arrival was noted to have myoclonic seizures.  She had a neurology consult.  Despite multiple attempts at seizure suppression she is continued disease. On 12/31/2017 she is been started on pentobarbital coma and a last ditch effort for burst suppression.  Should she fail pentobarbital coma the most likely will need withdrawal of care.  Currently on pentobarbital,  General: Currently pentobarbital, and heavily sedated nonresponsive HEENT: Tracheal tube in place gastric tube in place Neuro: Heavily sedated nonresponsive CV: Tones are regular PULM: even/non-labored, lungs bilaterally managed in bases ZO:XWRU, no bowel sounds.  Note rash on abdomen appears better Extremities: warm/dry, 1+ edema  Skin: no rashes or lesions    Intake/Output Summary (Last 24 hours) at 12/31/2017 0924 Last data filed at 12/31/2017 0800 Gross per 24 hour  Intake 4723.8 ml  Output 8450 ml  Net -3726.2 ml  Currently on Lasix 40 mg IV every 12 Recent Labs  Lab 12/29/17 0432 12/30/17 0417 12/31/17 0410  NA 150* 147* 143  K 4.3 3.0* 2.9*  CL 124* 112* 106  CO2 22 27 28   BUN 12 16 15   CREATININE 0.55 0.51 0.54  GLUCOSE 82 118* 106*   Recent Labs  Lab 12/29/17 0747 12/30/17 0417 12/31/17 0410  HGB 7.7* 7.9* 7.8*  HCT 25.6* 26.3* 25.2*  WBC 11.3* 9.7 8.7  PLT 193 218 175   Mr Brain Wo Contrast  Result Date: 12/30/2017 CLINICAL DATA:  42 y/o  F; anoxic brain injury after cardiac arrest. EXAM: MRI HEAD WITHOUT CONTRAST TECHNIQUE: Multiplanar, multiecho pulse sequences of the brain and surrounding structures were obtained without intravenous contrast. COMPARISON:  12/26/2017 MRI of the  head. FINDINGS: Brain: Interval development of diffusion hyperintense and T2 FLAIR hyperintense signal within the left occipital and posterior parietal cortex. No associated hemorrhage or mass effect. No extra-axial collection, hydrocephalus, or effacement of basilar cisterns. No herniation. No additional area of reduced diffusion to suggest ischemia. Vascular: Normal flow voids. Skull and upper cervical spine: Normal marrow signal. Sinuses/Orbits: Diffuse paranasal sinus mucosal thickening and mastoid effusions from intubation. Orbits are unremarkable. Other: None. IMPRESSION: 1. Interval development of mild increased cortical T2 FLAIR and diffusion signal within the left occipital and posterior parietal lobes which may represent findings of hypoxic injury or seizure activity. 2. Intubation and diffuse sinus/mastoid opacification. Electronically Signed   By: Mitzi Hansen M.D.   On: 12/30/2017 15:38   Dg Chest Port 1 View  Result Date: 12/31/2017 CLINICAL DATA:  Acute respiratory failure EXAM: PORTABLE CHEST 1 VIEW COMPARISON:  December 30, 2017 FINDINGS: The ETT is in good position. The NG tube terminates below today's film. A left central line is stable, terminating in the SVC. No pneumothorax. Patchy bibasilar opacities arm proved but persistent on the left and stable on the right. Cardiomediastinal silhouette is stable. IMPRESSION: 1. Support apparatus as above. 2. Mild bibasilar opacities are stable on the right and improved on the left. Electronically Signed   By: Gerome Sam III M.D   On: 12/31/2017 07:02   Dg Chest Port 1 View  Result Date: 12/30/2017 CLINICAL DATA:  Acute respiratory failure EXAM: PORTABLE CHEST 1 VIEW COMPARISON:  12/29/2017 FINDINGS: Stable cardiomegaly. Gastric tube extends below the left  hemidiaphragm. Endotracheal tube tip is seen 4.6 cm above the carina. Left IJ central line catheter is noted at the left brachiocephalic SVC junction. Hazy opacities are noted at  each lung base extending into the mid lung bilaterally. Improved aeration of the right lung base since prior. No acute osseous abnormality. IMPRESSION: Stable cardiomegaly with support line and tube positions. Bilateral airspace opacities are noted with slightly improved at the right lung base. Electronically Signed   By: Tollie Ethavid  Kwon M.D.   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  Her husband is aware of this.. -Follow neurological imaging  Vent dependent respiratory failure in the setting of cardiac arrest. -Continue current ventilator settings -Not weanable  Bibasilar pneumonia likely aspiration. -Currently on antibiotic holiday  New rash on abdomen -Monitor    Brett CanalesSteve Juliett Eastburn ACNP [facility] Pager [phone] till 1 pm If no answer page [phone] 12/31/2017, 9:28 AM

## 2017-12-31 NOTE — Progress Notes (Signed)
eLink Physician-Brief Progress Note Patient Name: Dorene GrebeKimberly Dearden DOB: Jul 02, 1975 MRN: [id number]   Date of Service  12/31/2017  HPI/Events of Note  Hypokalemia  eICU Interventions  Potassium replaced     Intervention Category Intermediate Interventions: Electrolyte abnormality - evaluation and management  DETERDING,ELIZABETH 12/31/2017, 5:59 AM

## 2018-01-01 ENCOUNTER — Inpatient Hospital Stay (HOSPITAL_COMMUNITY): Payer: Managed Care, Other (non HMO)

## 2018-01-01 LAB — BASIC METABOLIC PANEL
ANION GAP: 8 (ref 5–15)
BUN: 17 mg/dL (ref 6–20)
CHLORIDE: 106 mmol/L (ref 98–111)
CO2: 31 mmol/L (ref 22–32)
Calcium: 7.5 mg/dL — ABNORMAL LOW (ref 8.9–10.3)
Creatinine, Ser: 0.55 mg/dL (ref 0.44–1.00)
GFR calc Af Amer: >60 mL/min (ref >60)
GLUCOSE: 96 mg/dL (ref 70–99)
POTASSIUM: 4.1 mmol/L (ref 3.5–5.1)
Sodium: 145 mmol/L (ref 135–145)

## 2018-01-01 LAB — CBC WITH DIFFERENTIAL/PLATELET
ABS IMMATURE GRANULOCYTES: 0.2 10*3/uL — AB (ref 0.0–0.1)
BASOS PCT: 0 %
Basophils Absolute: 0 10*3/uL (ref 0.0–0.1)
Eosinophils Absolute: 0.2 10*3/uL (ref 0.0–0.7)
Eosinophils Relative: 1 %
HCT: 26 % — ABNORMAL LOW (ref 36.0–46.0)
HEMOGLOBIN: 8 g/dL — AB (ref 12.0–15.0)
Immature Granulocytes: 2 %
LYMPHS ABS: 1.3 10*3/uL (ref 0.7–4.0)
LYMPHS PCT: 12 %
MCH: 25.6 pg — AB (ref 26.0–34.0)
MCHC: 30.8 g/dL (ref 30.0–36.0)
MCV: 83.1 fL (ref 78.0–100.0)
MONO ABS: 0.9 10*3/uL (ref 0.1–1.0)
MONOS PCT: 8 %
NEUTROS ABS: 8.9 10*3/uL — AB (ref 1.7–7.7)
NEUTROS PCT: 77 %
PLATELETS: 115 10*3/uL — AB (ref 150–400)
RBC: 3.13 MIL/uL — ABNORMAL LOW (ref 3.87–5.11)
RDW: 17 % — ABNORMAL HIGH (ref 11.5–15.5)
WBC: 11.4 10*3/uL — ABNORMAL HIGH (ref 4.0–10.5)

## 2018-01-01 LAB — URINALYSIS, ROUTINE W REFLEX MICROSCOPIC
Bilirubin Urine: NEGATIVE
GLUCOSE, UA: NEGATIVE mg/dL
Hgb urine dipstick: NEGATIVE
Ketones, ur: 5 mg/dL — AB
Leukocytes, UA: NEGATIVE
NITRITE: NEGATIVE
PROTEIN: NEGATIVE mg/dL
Specific Gravity, Urine: 1.021 (ref 1.005–1.030)
pH: 7 (ref 5.0–8.0)

## 2018-01-01 LAB — VALPROIC ACID LEVEL: Valproic Acid Lvl: 53 ug/mL (ref 50.0–100.0)

## 2018-01-01 LAB — MAGNESIUM: MAGNESIUM: 1.9 mg/dL (ref 1.7–2.4)

## 2018-01-01 LAB — GLUCOSE, CAPILLARY
GLUCOSE-CAPILLARY: 94 mg/dL (ref 70–99)
GLUCOSE-CAPILLARY: 113 mg/dL — AB (ref 70–99)
Glucose-Capillary: 94 mg/dL (ref 70–99)
Glucose-Capillary: 93 mg/dL (ref 70–99)
Glucose-Capillary: 93 mg/dL (ref 70–99)

## 2018-01-01 LAB — PHOSPHORUS: Phosphorus: 3.2 mg/dL (ref 2.5–4.6)

## 2018-01-01 MED ORDER — POTASSIUM CHLORIDE 20 MEQ PO PACK
40.0000 meq | PACK | Freq: Once | ORAL | Status: AC
  Administered 2018-01-01: 40 meq
  Filled 2018-01-01: qty 2

## 2018-01-01 MED ORDER — VALPROATE SODIUM 500 MG/5ML IV SOLN
2250.0000 mg | Freq: Four times a day (QID) | INTRAVENOUS | Status: DC
  Administered 2018-01-01 – 2018-01-02 (×5): 2250 mg via INTRAVENOUS
  Filled 2018-01-01 (×8): qty 22.5

## 2018-01-01 NOTE — Progress Notes (Signed)
42 year old woman G3, P1, [facility] and underwent C-section. Unfortunately sustained cardiac arrest few hours later on 8/2 in the setting of hypertension and sudden onset dyspnea. She developed status epilepticus postarrest, initially required high-dose Versed and propofol for burst suppression. Due to high triglycerides, propofol was discontinued and ketamine used. 8/8 developed myoclonic seizures when Versed and ketamine was stopped. 8/9 pentobarb coma  Interim -remains under pentobarbital coma, diuresed well with Lasix  On exam-febrile overnight, critically ill, orally intubated, tongue remains swollen, anasarca has decreased, decreased breath sounds bilateral, mild secretions, S1-S2 normal, no pallor or icterus.  Labs show potassium normalized, mild increase in leukocytosis, stable anemia. Chest x-ray personally reviewed which shows improved bibasilar infiltrates.  Issues addressed on rounds today -  Status epilepticus-pentobarbital being tapered by neurology with EEG monitoring. She remains on heparin/Depakote/lacosamide  Anoxic encephalopathy- GPEDs with burst suppression pattern is a poor prognosis for meaningful neurological recovery.  We have continued with aggressive measures for seizure suppression due to minimal changes on the MRI  Acute respiratory failure-due to about, no weaning, will need tracheostomy if we are able to achieve seizure suppression Continue Lasix 40 every 12 for negative balance, replace potassium in anticipation  Bibasilar pneumonia-improved, ceftriaxone discontinued, procalcitonin normalized but fevers persist and WBC count rising slightly.  If continues, may have to restart antibiotics and reculture  My critical care time x 3043m  Cyril Mourningakesh Agnes Brightbill MD. FCCP. [place] [facility] Pager [phone] If no response call [phone]   01/01/2018

## 2018-01-01 NOTE — Progress Notes (Signed)
Subjective: Burst suppression continues with pentobarbital.   Exam: Vitals:   01/01/18 0743 01/01/18 0745  BP:  114/62  Pulse:  97  Resp:  (!) 31  Temp:    SpO2: 97% 97%   Gen: In bed, intubated Resp: ventilated Abd: soft, nt  Neuro: MS: Does not open eyes or follow commands. WG:NFAOZCN:PERRL, corneals intact Motor: no response  VPA level 53  Impression: 42 yo F with anoxic brain injury s/p cardiac arrest. Her suppressed background with GPEDs and burst suppression initially are strongly suggestive of poor prognosis.   That being said, with her young age and negative imaging 3 days after arrest, we pursued 48 hours of burst suppression. Following this, she resumed myoclonic seizures despite 4 AEDs. An MRI was repeated showing focal change in the left parietal region, but no definite diffuse anoxia.  Due to continued minimal changes on MRI, we have then pursued suppression with pentobarbital. I continue to suspect poor prognosis. If seizures(electrographic or myoclonic) resume after weaning pentoabrbital, I would not offer any further seizure treatment other than symptomatic control of myoclonus for family comfort. If she does nto restart seizing, liekly will need trach/peg as pentobarb coma will obscure exam for quite some time.   Recommendations: 1) increase depakote to 2.25g QID 2) continue keppra 1500mg  BID 3) continue vimpat 150mg  BID 4) will begin weaning pentobarbital to have it off 48 hours after start.   This patient is critically ill and at significant risk of neurological worsening, death and care requires constant monitoring of vital signs, hemodynamics,respiratory and cardiac monitoring, neurological assessment, discussion with family, other specialists and medical decision making of high complexity. I spent 45 minutes of neurocritical care time  in the care of  this patient.  Ritta SlotMcNeill Hydeia Mcatee, MD [facility] [phone]  If 7pm- 7am, please page neurology  on call as listed in AMION. 01/01/2018  7:50 AM

## 2018-01-01 NOTE — Procedures (Signed)
LTM-EEG Report  HISTORY: Continuous video-EEG monitoring performed for[age]year old withanoxia, myoclonic status. ACQUISITION: International 10-20 system for electrode placement; 18 channels with additional eyes linked to ipsilateral ears and EKG. Additional T1-T2 electrodes were used. Continuous video recording obtained.   EEG NUMBER:  MEDICATIONS:  Day 9: see EMR  DAY #9: from 0730 12/31/17 to 0730 01/01/18 BACKGROUND: An overall low voltage, burst-suppression recording withpoorspontaneous variability andabsentreactivity. The backgroundconsisted of low voltage burst-suppression attained around 2300 with periods of suppression lasting 20-40 seconds and bursts of delta-theta lasting 0.5-1 second.  EPILEPTIFORM/PERIODIC ACTIVITY: none SEIZURES: none EVENTS:none  EKG: no significant arrhythmia  SUMMARY: This was a markedly abnormal continuous video EEG due toa burst-suppression pattern and absent reactivity.This was indicative ofa severe, diffuse cerebral disturbance. Burst-suppression was continuous throughout the recording with shorter periods of suppression through the early morning hours.

## 2018-01-01 NOTE — Plan of Care (Signed)

## 2018-01-02 ENCOUNTER — Inpatient Hospital Stay (HOSPITAL_COMMUNITY): Payer: Managed Care, Other (non HMO)

## 2018-01-02 DIAGNOSIS — R609 Edema, unspecified: Secondary | ICD-10-CM

## 2018-01-02 LAB — PROCALCITONIN: PROCALCITONIN: 0.37 ng/mL

## 2018-01-02 LAB — CBC
HCT: 30.8 % — ABNORMAL LOW (ref 36.0–46.0)
Hemoglobin: 9.8 g/dL — ABNORMAL LOW (ref 12.0–15.0)
MCH: 25.8 pg — AB (ref 26.0–34.0)
MCHC: 31.8 g/dL (ref 30.0–36.0)
MCV: 81.1 fL (ref 78.0–100.0)
PLATELETS: 95 10*3/uL — AB (ref 150–400)
RBC: 3.8 MIL/uL — AB (ref 3.87–5.11)
RDW: 17 % — ABNORMAL HIGH (ref 11.5–15.5)
WBC: 20.8 10*3/uL — ABNORMAL HIGH (ref 4.0–10.5)

## 2018-01-02 LAB — BLOOD CULTURE ID PANEL (REFLEXED)
Acinetobacter baumannii: NOT DETECTED
CANDIDA ALBICANS: NOT DETECTED
CANDIDA TROPICALIS: NOT DETECTED
Candida glabrata: NOT DETECTED
Candida krusei: NOT DETECTED
Candida parapsilosis: NOT DETECTED
ENTEROBACTERIACEAE SPECIES: NOT DETECTED
ENTEROCOCCUS SPECIES: NOT DETECTED
Enterobacter cloacae complex: NOT DETECTED
Escherichia coli: NOT DETECTED
HAEMOPHILUS INFLUENZAE: NOT DETECTED
Klebsiella oxytoca: NOT DETECTED
Klebsiella pneumoniae: NOT DETECTED
LISTERIA MONOCYTOGENES: NOT DETECTED
METHICILLIN RESISTANCE: DETECTED — AB
NEISSERIA MENINGITIDIS: NOT DETECTED
PSEUDOMONAS AERUGINOSA: NOT DETECTED
Proteus species: NOT DETECTED
STREPTOCOCCUS PYOGENES: NOT DETECTED
STREPTOCOCCUS SPECIES: NOT DETECTED
Serratia marcescens: NOT DETECTED
Staphylococcus aureus (BCID): NOT DETECTED
Staphylococcus species: DETECTED — AB
Streptococcus agalactiae: NOT DETECTED
Streptococcus pneumoniae: NOT DETECTED

## 2018-01-02 LAB — GLUCOSE, CAPILLARY
GLUCOSE-CAPILLARY: 95 mg/dL (ref 70–99)
GLUCOSE-CAPILLARY: 135 mg/dL — AB (ref 70–99)
Glucose-Capillary: 106 mg/dL — ABNORMAL HIGH (ref 70–99)
Glucose-Capillary: 121 mg/dL — ABNORMAL HIGH (ref 70–99)
Glucose-Capillary: 127 mg/dL — ABNORMAL HIGH (ref 70–99)

## 2018-01-02 LAB — BASIC METABOLIC PANEL
Anion gap: 12 (ref 5–15)
BUN: 21 mg/dL — AB (ref 6–20)
CHLORIDE: 101 mmol/L (ref 98–111)
CO2: 26 mmol/L (ref 22–32)
Calcium: 7.9 mg/dL — ABNORMAL LOW (ref 8.9–10.3)
Creatinine, Ser: 0.54 mg/dL (ref 0.44–1.00)
GFR calc Af Amer: >60 mL/min (ref >60)
GFR calc non Af Amer: >60 mL/min (ref >60)
Glucose, Bld: 115 mg/dL — ABNORMAL HIGH (ref 70–99)
Potassium: 4.9 mmol/L (ref 3.5–5.1)
SODIUM: 139 mmol/L (ref 135–145)

## 2018-01-02 LAB — MAGNESIUM: MAGNESIUM: 2.2 mg/dL (ref 1.7–2.4)

## 2018-01-02 LAB — VALPROIC ACID LEVEL: Valproic Acid Lvl: 69 ug/mL (ref 50.0–100.0)

## 2018-01-02 LAB — PHOSPHORUS: PHOSPHORUS: 3.1 mg/dL (ref 2.5–4.6)

## 2018-01-02 MED ORDER — POLYVINYL ALCOHOL 1.4 % OP SOLN: 1.0000 [drp] | OPHTHALMIC | 0 refills | Status: AC | PRN

## 2018-01-02 MED ORDER — POTASSIUM CHLORIDE 20 MEQ/15ML (10%) PO SOLN
40.0000 meq | Freq: Two times a day (BID) | ORAL | Status: DC
  Administered 2018-01-02: 40 meq
  Filled 2018-01-02: qty 30

## 2018-01-02 MED ORDER — VANCOMYCIN HCL IN DEXTROSE 1-5 GM/200ML-% IV SOLN
1000.0000 mg | Freq: Three times a day (TID) | INTRAVENOUS | Status: DC
  Administered 2018-01-02: 1000 mg via INTRAVENOUS
  Filled 2018-01-02 (×3): qty 200

## 2018-01-02 MED ORDER — GERHARDT'S BUTT CREAM: 1.0000 "application " | TOPICAL_CREAM | Freq: Three times a day (TID) | CUTANEOUS | Status: AC

## 2018-01-02 MED ORDER — SODIUM CHLORIDE 0.9 % IV SOLN
2.0000 g | INTRAVENOUS | Status: DC
  Administered 2018-01-02: 2 g via INTRAVENOUS
  Filled 2018-01-02: qty 20

## 2018-01-02 MED ORDER — LORAZEPAM 2 MG/ML IJ SOLN: 1.0000 mg | INTRAMUSCULAR | Status: DC | PRN

## 2018-01-02 MED ORDER — SODIUM CHLORIDE 0.9 % IV SOLN
2.0000 g | Freq: Three times a day (TID) | INTRAVENOUS | Status: DC
  Filled 2018-01-02 (×3): qty 2

## 2018-01-02 MED ORDER — VALPROATE SODIUM 500 MG/5ML IV SOLN: 2250.0000 mg | Freq: Four times a day (QID) | INTRAVENOUS | Status: AC

## 2018-01-02 MED ORDER — POTASSIUM CHLORIDE 20 MEQ/15ML (10%) PO SOLN: 40.0000 meq | Freq: Two times a day (BID) | ORAL | 0 refills | Status: AC

## 2018-01-02 MED ORDER — LEVETIRACETAM IN NACL 1500 MG/100ML IV SOLN: 1500.0000 mg | Freq: Two times a day (BID) | INTRAVENOUS | Status: AC

## 2018-01-02 MED ORDER — SODIUM CHLORIDE 0.9 % IV SOLN: 2.0000 g | INTRAVENOUS | Status: AC

## 2018-01-02 MED ORDER — IBUPROFEN 100 MG/5ML PO SUSP
400.0000 mg | Freq: Four times a day (QID) | ORAL | Status: DC | PRN
  Administered 2018-01-02 (×2): 400 mg via ORAL
  Filled 2018-01-02 (×2): qty 20

## 2018-01-02 MED ORDER — PRENATAL MULTIVITAMIN CH: 1.0000 | ORAL_TABLET | Freq: Every day | ORAL | Status: AC

## 2018-01-02 MED ORDER — VITAL HIGH PROTEIN PO LIQD: 1000.0000 mL | ORAL | Status: AC

## 2018-01-02 MED ORDER — POLYETHYLENE GLYCOL 3350 17 G PO PACK: 17.0000 g | PACK | Freq: Two times a day (BID) | ORAL | 0 refills | Status: AC

## 2018-01-02 MED ORDER — FUROSEMIDE 10 MG/ML IJ SOLN: 40.0000 mg | Freq: Two times a day (BID) | INTRAMUSCULAR | 0 refills | Status: AC

## 2018-01-02 MED ORDER — LORAZEPAM 2 MG/ML IJ SOLN: 1.0000 mg | INTRAMUSCULAR | 0 refills | Status: AC | PRN

## 2018-01-02 MED ORDER — ALTEPLASE 2 MG IJ SOLR
4.0000 mg | Freq: Once | INTRAMUSCULAR | Status: AC
  Administered 2018-01-02: 4 mg
  Filled 2018-01-02: qty 4

## 2018-01-02 MED ORDER — CHLORHEXIDINE GLUCONATE 0.12% ORAL RINSE (MEDLINE KIT): 15.0000 mL | Freq: Two times a day (BID) | OROMUCOSAL | 0 refills | Status: AC

## 2018-01-02 MED ORDER — FENTANYL BOLUS VIA INFUSION: 50.0000 ug | INTRAVENOUS | 0 refills | Status: AC | PRN

## 2018-01-02 MED ORDER — VANCOMYCIN HCL 10 G IV SOLR
2500.0000 mg | Freq: Once | INTRAVENOUS | Status: DC
  Filled 2018-01-02: qty 2500

## 2018-01-02 MED ORDER — LABETALOL HCL 5 MG/ML IV SOLN: 10.0000 mg | INTRAVENOUS | Status: AC | PRN

## 2018-01-02 MED ORDER — DOCUSATE SODIUM 50 MG/5ML PO LIQD: 100.0000 mg | Freq: Two times a day (BID) | ORAL | 0 refills | Status: AC

## 2018-01-02 MED ORDER — FAMOTIDINE IN NACL 20-0.9 MG/50ML-% IV SOLN: 20.0000 mg | Freq: Two times a day (BID) | INTRAVENOUS | Status: AC

## 2018-01-02 MED ORDER — SODIUM CHLORIDE 0.9 % IV SOLN: 200.0000 mg | Freq: Two times a day (BID) | INTRAVENOUS | Status: AC

## 2018-01-02 MED ORDER — ONDANSETRON HCL 4 MG/2ML IJ SOLN: 4.0000 mg | Freq: Three times a day (TID) | INTRAMUSCULAR | 0 refills | Status: AC | PRN

## 2018-01-02 MED ORDER — BISACODYL 10 MG RE SUPP: 10.0000 mg | Freq: Every day | RECTAL | 0 refills | Status: AC | PRN

## 2018-01-02 MED ORDER — ORAL CARE MOUTH RINSE: 15.0000 mL | OROMUCOSAL | 0 refills | Status: AC

## 2018-01-02 MED ORDER — MIDAZOLAM HCL 2 MG/2ML IJ SOLN: 1.0000 mg | INTRAMUSCULAR | 0 refills | Status: AC | PRN

## 2018-01-02 MED ORDER — ALBUTEROL SULFATE (2.5 MG/3ML) 0.083% IN NEBU: 2.5000 mg | INHALATION_SOLUTION | Freq: Four times a day (QID) | RESPIRATORY_TRACT | 12 refills | Status: AC

## 2018-01-02 MED ORDER — VANCOMYCIN HCL IN DEXTROSE 1-5 GM/200ML-% IV SOLN: 1000.0000 mg | Freq: Three times a day (TID) | INTRAVENOUS | Status: AC

## 2018-01-02 NOTE — Progress Notes (Signed)
EEG team attempted to be contacted multiple times for pt's discharge - voicemail left on their office machine. Charge nurse aware. EEG leads safely removed from patient and placed in secure location in room 3M04. No skin breakdown noted after removal of leads.   Will close this room until EEG team is able to disconnect EEG materials.   Margot AblesWhitney Gillon, RN

## 2018-01-02 NOTE — Progress Notes (Signed)
I saw the patient on Saturday - was still in coma and no change.  Last night the phenobarbital was turned off and today pt's EEG is still showing burst seizure activity. A long discussion with the family ensued and they are requesting a second opinion.  Dr. Celine Mansesai has called [facility] and [facility].   As attending physician of record, I have filled out discharge orders, summary and EMTALA form.    I talked to the family and gave her mother my number for her to contact me with changes.    I am profoundly grateful for the care from the nurses, techs and physicians while Cala BradfordKimberly has been here on the Unit.

## 2018-01-02 NOTE — Progress Notes (Signed)
LTM EEG checked, no skin break down at Fp2 or Fp1, paste added to C3, P3

## 2018-01-02 NOTE — Progress Notes (Signed)
eLink Physician-Brief Progress Note Patient Name: Laura GrebeKimberly Schlatter DOB: Oct 10, 1975 MRN: [id number]   Date of Service  01/02/2018  HPI/Events of Note  Fever to 100.5 - Abx for pneumonia now D/Ced. AST = 42 and Creatinine = 0.54. Already on cooling blanket.   eICU Interventions  Will order: 1. Motrin suspension 400 mg per tube Q 6 hours PRN Te,[ > 100.5 F.     Intervention Category Major Interventions: Other:  Kenitra Leventhal Dennard Nipugene 01/02/2018, 5:27 AM

## 2018-01-02 NOTE — Progress Notes (Signed)
PCCM Interval Progress Note  Discussed case with [facility] who have generously agreed to accept pt in transfer for second opinion.  Family updated.  Also saw by Dr. Henderson CloudHorvath in passing and relayed this to her.   [facility] to call our unit once bed is available and transport has been coordinated.  RN updated with current plan of care.   Rutherford Guysahul Desai, [place]PA - [facility] Pager: [phone]  or [phone] 01/02/2018, 2:20 PM

## 2018-01-02 NOTE — Discharge Summary (Signed)
Physician Discharge Summary  Patient ID: Laura GrebeKimberly Stammer MRN: [id number] DOB/AGE: [birth year] 42 y.o.  Admit date: 12/22/2017 Discharge date: 01/02/2018  Admission Diagnoses:severe preeclampsia, term pregnant, history of cesarean delivery.  Discharge Diagnoses:  Active Problems:   Postoperative state   Cardiac arrest (HCC)   Acute respiratory failure (HCC)   Status epilepticus (HCC)   Eclampsia (toxemia of pregnancy)   ARDS (adult respiratory distress syndrome) (HCC)   Acute respiratory failure with hypoxemia (HCC)   Discharged Condition: critical  Hospital Course: Pt was admitted on 12-22-17 after being treated with the Hypertension protocol for severe preeclampsia.  Pt was placed on magnesium sulfate for seizure prophylaxis and delivery was planned for 6 hours after last full meal.  Delivery was by cesarean section with BTL with Spinal anesthesia at about 20:30 that night and was completely uncomplicated.  Pt did receive breathing treatment in PACU but lungs per respiratory were clear.  Pt was maintained on magnesium sulfate intra and post partum.  Pt was stable with stable vitals until about 05:50 in the am when she complained of not being able to breath and very shortly after arrested completely.  Resusitation was commenced with CPR and other modalities and pulse came back.  Pt was stabilized and [facility]  Pt began seizing shortly after coming to [facility] and the magnesium sulfate was turned on for another 48 hours and neurology was consulted.  Pt's Head MRI and CT were normal and chest/abd  CT showed no evidence of PE or intraabdominal bleeding.  Pt was maintained in several induced comas for several days but each time she was weaned, the seizures returned.  The last coma was induced with phenobarbital and when weaned, the seizures again returned,  The prognosis at this time is poor but the family are requesting a second opinion.  [facility].  I will defer to the critical care physicians to add the details of her care and course besides the above to their own summary.   Consults: pulmonary/intensive care, ID, neurology and rehabilitation medicine  Significant Diagnostic Studies: see chart for details  Treatments: respiratory therapy: O2 and intubation, surgery: repeat cesarean section and BTL and See Critical Care discharge summary.   Discharge Exam: Blood pressure (!) 148/83, pulse (!) 123, temperature (!) 101 F (38.3 C), resp. rate (!) 31, height 5\' 3"  (1.6 m), weight 121 kg, last menstrual period 03/29/2017, SpO2 97 %, unknown if currently breastfeeding.   [facility].       Signed: Kelcy Baeten A 01/02/2018, 2:14 PM

## 2018-01-02 NOTE — Discharge Summary (Signed)
Physician Discharge Summary  Patient ID: Makilah Dowda MRN: [id number] DOB/AGE: 11-08-1975 42 y.o.  Admit date: 12/22/2017 Discharge date: 01/02/2018                           DISCHARGE PLAN BY DIAGNOSIS    Status epilepticus with anoxic encephalopathy - on multiple AED's that are being managed by neuro.  We have continued with aggressive measures for seizure suppression due to minimal changes on the MRI and pt's young age.  Now s/p two separate burst suppression trials (first with propofol and versed; though propofol switched to ketamine due to hypertriglyceridemia, second with pentobarbital); however, unfortunately cEEG shows continued spikes as sedation is lifted.  Per neuro, nothing else to offer at this point and very poor prognosis going forward. Plan: Continue AED's per neuro (Valproate/Lacosamide/Levetiracetam). PRN ativan added (in case needed during transport). Pt accepted in transfer to [facility] for second opinion. Poor prognosis given, family informed to prepare for the worst and possible comfort care / withdrawal.  Acute respiratory failure - due to above. Plan: Continue full vent support. No weaning given status. Continue lasix. Follow CXR.  Staph Aureus PNA - per sputum cultures 8/6. Plan: Abx broadened 8/12 due to rising WBC and fever, currently on vanc / cefepime.  Nutrition. GI prophylaxis. Plan: Continue tube feeds. SUP: famotidine.  Anasarca - has had great UOP on lasix; however, remains +14.5L positive. Plan: Continue lasix.  Thrombocytopenia - no evidence of bleeding. Plan: Monitor platelet counts. Hold chemical VTE prophylaxis and stick with SCD's only.                DISCHARGE SUMMARY   Giavanna Kang is a 42 y.o. y/o female with a PMH of GERD, endometriosis, HSV.  She is G3 P1-0-1-1 and presented to [facility] on 12/22/2017 with hypertension / pre-eclampsia.  She underwent repeat C-section at that time (38 weeks 2 days) with spinal  anesthesia.  During early AM hours 8/2, she had SBP in 240's.  This was treated and magnesium was also continued.  She later complained of dyspnea and when nurse later returned to the room, she found her with blue lips and in cardiac arrest.  She was shocked x 1 for an unknown rhythm and required roughly 4 - 5 minutes of CPR prior to ROSC.  Post ROSC, she was hypotensive and required pressors briefly.  She was then transferred to [facility] ICU for further evaluation and management.  Due to concern for PE, CTA was obtained; however, this was negative and only showed aspiration or non-cardiogenic edema.  She also had head CT which was negative.  After arrival to [facility] ICU, she had myoclonic / seizure like activity.  Brain MRI was obtained and was negative.  Unfortunately she had a complicated course due to refractory status epilepticus despite multiple AED's.  Neuro adjusted AED's as able without much success.  She was then put under burst suppression with propofol and versed infusions.  During first burst suppression, she had hypertriglyceridemia; therefore, propofol was discontinued and ketamine was started instead.  This resolved her seizure activity; however, once it was stopped, seizures recurred.  Due to her young age and negative MRI, neurology felt that she needed aggressive measures in an attempt to break her seizures.  A second burst suppression was attempted using pentobarb.  This was started on 8/9 and continued through the evening of 8/11.  Per neuro plan, if seizures (electrographic or myoclonic) resumed after weaning pentobarbital,  they would not offer further seizure management other than symptomatic control of myoclonus for family comfort.  If seizures resolved fully, then trach / PEG could be considered while awaiting pentobarb coma to resolve.  Unfortunately, once again as the pentobarb was weaned, she had recurrent generalized spikes on cEEG as well as myoclonic activity on physical exam.  During  AM rounds on 8/12, we met with family and neuro to discuss cEEG findings and again relay a poor prognosis.  Husband asked for the possibility of transfer to [facility] for a second opinion.  Neuro and CCM agreed that we would reach out to [facility] and request a second opinion; however, could not guarantee that she would be accepted and informed family that [facility] may feel that they also don't have much else to offer.  After discussing the case with [facility] afternoon of 8/12, she was accepted in transfer for second opinion.  Family was updated on this and questions were answered.  Pt was transferred to [facility] 01/02/18 for second opinion and ongoing care.   Of note, on 12/27/17, pt had episode of desaturation during suctioning.  On exam, she had absent BS on left.  POCUS was obtained and sliding lung was noted.  CXR also obtained and was negative for pneumothorax. Bronchoscopy performed and thick secretions noted, too thick to remove from size 6 ETT.  ETT exchanged for 7.5 ETT and BAL then performed.  BAL grew out staph aureus (sensitivities pending).  She was initially on aztreonam which was narrowed to ceftriaxone and this was stopped on 8/9.  She then had fever spike with worsening leukocytosis; therefore, abx were broadened to vanc / ceftriaxone on 8/12.    SIGNIFICANT DIAGNOSTIC STUDIES CTA chest 8/2 > b/l airspace disease, aspiration or non-cardiogenic edema.  No PE. CT head 8/2 > negative. CT A / P 8/2 > negative. MRI / MRV brain 8/2 > no evidence of anoxia or hypoxic / ischemic insult.  MRV without evidence of sinovenous occlusive disease. MRI brain 8/5 > no evident anoxic injury. MRI brain 8/9 > interval development of mild increased cortical T2 flair and diffusion signal within left occipital and posterior parietal lobes which may represent findings of hypoxic injury or seizure activity. Echo 8/2 > EF 55 - 60%, G1DD.  EEG 8/2 > burst suppression pattern. EEG 8/3 > severe encephalopathy, non-specific. EEG  8/4 > severe encephalopathy, no seizures. EEG 8/5 > increased and near continuous generalized spike and polyspike discharges. EEG 8/6 > spiking with myoclonus, followed by burst suppression pattern. EEG 8/7 > diffuse spiking and burst suppression. EEG 8/8 > burst suppression. EEG 8/9 > suppressed background with superimposed rapid poly spikes. EEG 8/10 > burst suppression. EEG 8/11 > bust suppression. EEG 8/12 > resolved burst suppression with continuous spiking.  SIGNIFICANT EVENTS 8/1 > C-section. 8/2 > Brief arrest, intubated, transferred to [facility]. 8/6 > 1st burst suppression (propfol and versed, propofol then switched to ketamine due to hypertriglyceridemia). 8/9 > 2nd burst suppression (pentobarbital). 8/12 > transferred to [facility].  MICRO DATA  Sputum 8/2 > neg. Blood 8/2 > neg. Sputum 8/6 > MSSA > staph aureus (sens pending)  ANTIBIOTICS Vanc 8/2 > 8/4 Cefepime 8/2 > 8/3 Flagyl 8/2 > 8/3 Aztreonam 8/3 > 8/7 Ceftriaxone 8/7 > 8/9 Vanc 8/12 >  Ceftriaxone 8/12 >   CONSULTS OB. Neuro.  TUBES / LINES ETT 8/1 >    Discharge Exam: General: Adult female, critically ill. Neuro: Sedated, remains in coma.  Has frequent myoclonic activity. HEENT: ETT  in place.  Tongue edematous.  Sclerae anicteric. Cardiovascular: RRR, no M/R/G.  Lungs: Respirations even and unlabored.  CTA bilaterally, No W/R/R. Abdomen:  Obese. BS x 4, soft, NT/ND.  Musculoskeletal: No gross deformities, no edema. Anasarca. Skin: Intact, warm, no rashes.   Vitals:   01/02/18 1330 01/02/18 1400 01/02/18 1430 01/02/18 1446  BP: (!) 148/83 (!) 149/87 (!) 146/85   Pulse: (!) 123 (!) 122 (!) 122   Resp: (!) 31 (!) 30 (!) 30   Temp:      TempSrc:      SpO2: 97% 99% 98% 98%  Weight:      Height:         Discharge Labs  BMET Recent Labs  Lab 12/29/17 0432 12/29/17 1848 12/30/17 0417 12/30/17 2039 12/31/17 0410 01/01/18 0444 01/02/18 0328  NA 150*  --  147*  --  143 145 139  K 4.3  --  3.0*   --  2.9* 4.1 4.9  CL 124*  --  112*  --  106 106 101  CO2 22  --  27  --  _0 GLUCOSE 82  --  118*  --  106* 96 115*  BUN 12  --  16  --  15 17 21*  CREATININE 0.55  --  0.51  --  0.54 0.55 0.54  CALCIUM 7.6*  --  7.8*  --  7.4* 7.5* 7.9*  MG 1.9 1.8 1.9 1.8  --  1.9 2.2  PHOS 2.5 2.6 2.0* 2.7  --  3.2 3.1    CBC Recent Labs  Lab 12/31/17 0410 01/01/18 0444 01/02/18 0726  HGB 7.8* 8.0* 9.8*  HCT 25.2* 26.0* 30.8*  WBC 8.7 11.4* 20.8*  PLT 175 115* 95*    Anti-Coagulation Recent Labs  Lab 12/28/17 0635 12/29/17 0432  INR 1.12 1.11          Allergies as of 01/02/2018      Reactions   Penicillins Anaphylaxis   Has patient had a PCN reaction causing immediate rash, facial/tongue/throat swelling, SOB or lightheadedness with hypotension: Yes Has patient had a PCN reaction causing severe rash involving mucus membranes or skin necrosis: No Has patient had a PCN reaction that required hospitalization: No Has patient had a PCN reaction occurring within the last 10 years: No Childhood reaction. If all of the above answers are "NO", then may proceed with Cephalosporin use.   Latex Other (See Comments)   Sores   Sulfa Antibiotics Other (See Comments)   Mouth Sores      Medication List    STOP taking these medications   cetirizine 10 MG tablet Commonly known as:  ZYRTEC   omeprazole 40 MG capsule Commonly known as:  PRILOSEC   valACYclovir 500 MG tablet Commonly known as:  VALTREX     TAKE these medications   albuterol (2.5 MG/3ML) 0.083% nebulizer solution Commonly known as:  PROVENTIL Take 3 mLs (2.5 mg total) by nebulization every 6 (six) hours.   bisacodyl 10 MG suppository Commonly known as:  DULCOLAX Place 1 suppository (10 mg total) rectally daily as needed for moderate constipation.   cefTRIAXone 2 g in sodium chloride 0.9 % 100 mL Inject 2 g into the vein daily.   chlorhexidine gluconate (MEDLINE KIT) 0.12 % solution Commonly known as:   PERIDEX 15 mLs by Mouth Rinse route 2 (two) times daily.   docusate 50 MG/5ML liquid Commonly known as:  COLACE Place 10 mLs (100 mg total) into feeding  tube 2 (two) times daily.   famotidine 20-0.9 MG/50ML-% Commonly known as:  PEPCID Inject 50 mLs (20 mg total) into the vein every 12 (twelve) hours.   feeding supplement (VITAL HIGH PROTEIN) Liqd liquid Place 1,000 mLs into feeding tube continuous.   fentaNYL Soln Commonly known as:  SUBLIMAZE Inject 50 mcg into the vein every hour as needed.   furosemide 10 MG/ML injection Commonly known as:  LASIX Inject 4 mLs (40 mg total) into the vein every 12 (twelve) hours.   Gerhardt's butt cream Crea Apply 1 application topically 3 (three) times daily.   labetalol 5 MG/ML injection Commonly known as:  NORMODYNE,TRANDATE Inject 2-4 mLs (10-20 mg total) into the vein every 2 (two) hours as needed (Until SBP < 170 mmHg).   lacosamide 200 mg in sodium chloride 0.9 % 25 mL Inject 200 mg into the vein every 12 (twelve) hours.   levETIRacetam 1500 MG/100ML Soln Commonly known as:  KEPPRA Inject 100 mLs (1,500 mg total) into the vein every 12 (twelve) hours. Start taking on:  01/03/2018   LORazepam 2 MG/ML injection Commonly known as:  ATIVAN Inject 0.5-1 mLs (1-2 mg total) into the vein every hour as needed for seizure.   midazolam 2 MG/2ML Soln injection Commonly known as:  VERSED Inject 1-2 mLs (1-2 mg total) into the vein every hour as needed (szs).   mouth rinse Liqd solution 15 mLs by Mouth Rinse route every 2 (two) hours.   ondansetron 4 MG/2ML Soln injection Commonly known as:  ZOFRAN Inject 2 mLs (4 mg total) into the vein every 8 (eight) hours as needed for nausea.   polyethylene glycol packet Commonly known as:  MIRALAX / GLYCOLAX Place 17 g into feeding tube 2 (two) times daily.   polyvinyl alcohol 1.4 % ophthalmic solution Commonly known as:  LIQUIFILM TEARS Place 1 drop into both eyes as needed for dry eyes.    potassium chloride 20 MEQ/15ML (10%) Soln Place 30 mLs (40 mEq total) into feeding tube 2 (two) times daily.   prenatal multivitamin Tabs tablet Place 1 tablet into feeding tube daily at 12 noon. Start taking on:  01/03/2018 What changed:    how to take this  when to take this   valproate 2,250 mg in dextrose 5 % 50 mL Inject 2,250 mg into the vein every 6 (six) hours.   vancomycin 1-5 GM/200ML-% Soln Commonly known as:  VANCOCIN Inject 200 mLs (1,000 mg total) into the vein every 8 (eight) hours.        Disposition: [facility].  Discharged Condition: Yanel Dombrosky has met maximum benefit of inpatient care and is medically stable and cleared for discharge.  Patient is pending follow up as above.      Time spent on disposition:  Greater than 35 minutes.   Montey Hora, [place] [facility] Pgr: [phone]  or [phone]

## 2018-01-02 NOTE — Progress Notes (Signed)
Pharmacy Antibiotic Note  Rosellen Lichtenberger is a 42 y.o. female admitted on 12/22/2017 after cardiac arrest due to hypertension and dyspnea s/p C-section .  Pharmacy has been consulted for vancomycin dosing. Previously completed a course of ceftriaxone for MSSA pneumonia on 8/9. Broad spectrum antibiotics reordered due to increase in WBC and a Tmax of 102.  Plan: Vancomycin 2514m IV x1 then 10064mIV Q8H Goal trough 15-20 mcg/ml Cefepime 2g Q8H Follow labs, cultures, renal function and vancomycin levels as necessary. Follow up goals of care   Height: _0  (160 cm) Weight: 266 lb 12.1 oz (121 kg) IBW/kg (Calculated) : 52.4  Temp (24hrs), Avg:100.3 F (37.9 C), Min:99.3 F (37.4 C), Max:102 F (38.9 C)  Recent Labs  Lab 12/29/17 0432 12/29/17 0747 12/30/17 0417 12/31/17 0410 01/01/18 0444 01/02/18 0328 01/02/18 0726  WBC  --  11.3* 9.7 8.7 11.4*  --  20.8*  CREATININE 0.55  --  0.51 0.54 0.55 0.54  --     Estimated Creatinine Clearance: 117.8 mL/min (by C-G formula based on SCr of 0.54 mg/dL).    Allergies  Allergen Reactions  . Penicillins Anaphylaxis    Has patient had a PCN reaction causing immediate rash, facial/tongue/throat swelling, SOB or lightheadedness with hypotension: Yes Has patient had a PCN reaction causing severe rash involving mucus membranes or skin necrosis: No Has patient had a PCN reaction that required hospitalization: No Has patient had a PCN reaction occurring within the last 10 years: No Childhood reaction. If all of the above answers are "NO", then may proceed with Cephalosporin use.   . Latex Other (See Comments)    Sores  . Sulfa Antibiotics Other (See Comments)    Mouth Sores    Antimicrobials this admission: Cefepime 8/2>>8/3; 8/12>> Flagyl 8/2>>8/3 Vanco 8/2>> 8/4 Aztreonam 8/3>8/7 Ceftriaxone 8/7>>8/9 Vanc 8/12>>   Microbiology results: 8/2 blood x 2 - NEG 8/2 Resp - NRF 8/2 MRSA pcr - NEG 8/6 BAL - 3K MSSA 8/11 TA -  few SA; pending 8/11 BCx - ngtd  Thank you for allowing pharmacy to be a part of this patient's care.  CoHarrietta GuardianPharmD PGY1 Pharmacy Resident Direct Phone [phone]/04/2018    12:08 PM

## 2018-01-02 NOTE — Progress Notes (Signed)
PHARMACY - PHYSICIAN COMMUNICATION CRITICAL VALUE ALERT - BLOOD CULTURE IDENTIFICATION (BCID)  Laura Valentine is an 42 y.o. female who [facility] with a chief complaint of severe preeclampsia  Assessment:  Currently on vancomycin/ceftriaxone for pneumonia. 1/4 blood cultures with coag negative staph. Likely contaminant   Name of physician (or Provider) Contacted: Dr. Arsenio LoaderSommer  Current antibiotics: Vancomycin / Ceftriaxone   Changes to prescribed antibiotics recommended:  Patient is on recommended antibiotics - No changes needed  Results for orders placed or performed during the hospital encounter of 12/22/17  Blood Culture ID Panel (Reflexed) (Collected: 01/01/2018 11:21 AM)  Result Value Ref Range   Enterococcus species NOT DETECTED NOT DETECTED   Listeria monocytogenes NOT DETECTED NOT DETECTED   Staphylococcus species DETECTED (A) NOT DETECTED   Staphylococcus aureus NOT DETECTED NOT DETECTED   Methicillin resistance DETECTED (A) NOT DETECTED   Streptococcus species NOT DETECTED NOT DETECTED   Streptococcus agalactiae NOT DETECTED NOT DETECTED   Streptococcus pneumoniae NOT DETECTED NOT DETECTED   Streptococcus pyogenes NOT DETECTED NOT DETECTED   Acinetobacter baumannii NOT DETECTED NOT DETECTED   Enterobacteriaceae species NOT DETECTED NOT DETECTED   Enterobacter cloacae complex NOT DETECTED NOT DETECTED   Escherichia coli NOT DETECTED NOT DETECTED   Klebsiella oxytoca NOT DETECTED NOT DETECTED   Klebsiella pneumoniae NOT DETECTED NOT DETECTED   Proteus species NOT DETECTED NOT DETECTED   Serratia marcescens NOT DETECTED NOT DETECTED   Haemophilus influenzae NOT DETECTED NOT DETECTED   Neisseria meningitidis NOT DETECTED NOT DETECTED   Pseudomonas aeruginosa NOT DETECTED NOT DETECTED   Candida albicans NOT DETECTED NOT DETECTED   Candida glabrata NOT DETECTED NOT DETECTED   Candida krusei NOT DETECTED NOT DETECTED   Candida parapsilosis NOT  DETECTED NOT DETECTED   Candida tropicalis NOT DETECTED NOT DETECTED    Vinnie LevelBenjamin Telia Amundson, PharmD., BCPS Clinical Pharmacist Clinical phone for 01/02/18 until 3:30pm: [phone] If after 3:30pm, please refer to [facility] for unit-specific pharmacist

## 2018-01-02 NOTE — Procedures (Signed)
LTM-EEG Report  HISTORY: Continuous video-EEG monitoring performed for[age]year old withanoxia, myoclonic status. ACQUISITION: International 10-20 system for electrode placement; 18 channels with additional eyes linked to ipsilateral ears and EKG. Additional T1-T2 electrodes were used. Continuous video recording obtained.   EEG NUMBER:  MEDICATIONS:  Day10: see EMR  DAY #10: from 0730 01/01/18 to 0730 01/02/18 BACKGROUND: Anoverall low voltage, suppressedrecording withpoorspontaneous variability and no clear reactivity. The record consisted of a burst-suppression pattern similar to the previous day until around 1900 after sedation was weaned. There was emergence of some low voltage, irregular delta activity in the bilateral temporal regions as periods of suppression dissipated. EPILEPTIFORM/PERIODIC ACTIVITY: With weaning of sedation, there was re-emergence of diffuse, continuous spiking in the bilateral central regions without clinical signs. These spikes were stable from 2300 onwards.  SEIZURES: Continuous rapid spiking as above without clinical signs reported. EVENTS:none  EKG: no significant arrhythmia  SUMMARY: This was a markedly abnormal continuous video EEG due toa resolvedburst-suppression pattern and generalized background slowing. Continuous spiking was present in the bilateral central regions as sedation was lifted overnight, similar to the patient's previous EEG recording prior to sedation trials. There was no clear reactivity. There was some emergence of low voltage delta background over the past 24 hours however.

## 2018-01-02 NOTE — Progress Notes (Signed)
[age] year old woman G3, P1, [facility] and underwent C-section. Unfortunately sustained cardiac arrest few hours later on 8/2 in the setting of hypertension and sudden onset dyspnea. She developed status epilepticus postarrest, initially required high-dose Versed and propofol for burst suppression. Due to high triglycerides, propofol was discontinued and ketamine used. 8/8 developed myoclonic seizures when Versed and ketamine was stopped. 8/9 pentobarb coma.  Pentobarb weaned to off and discontinued at 1800 on 8/11.  Interim - pentobarb discontinued yesterday evening.  Repeat EEG unfortunately showing continuous rapid spiking as sedation was lifted.  Per RN, earlier this AM pt had some questionable occular twitching; however, has none at this time. Febrile overnight, started on motrin in addition to cooling blanket.  BP (!) 147/75   Pulse (!) 120   Temp (!) 101 F (38.3 C)   Resp (!) 28   Ht 5\' 3"  (1.6 m)   Wt 121 kg   LMP 03/29/2017   SpO2 98%   Breastfeeding? Unknown   BMI 47.25 kg/m   Physical Exam: General: Adult female, critically ill. Neuro: Sedated, remains in coma. HEENT: Tongue edema remains.  Otherwise no changes. Cardiovascular: RRR, no M/R/G.  Lungs: Respirations even and unlabored.  CTA bilaterally, No W/R/R. Abdomen: Obese, BS x 4, soft, NT/ND.  Musculoskeletal: No gross deformities, no edema. Anasarca Skin: Intact, warm, no rashes.   Chest x-ray personally reviewed - stable with improved bibasilar infiltrates.  Issues addressed on rounds today -  Status epilepticus - on multiple AED's that are being managed by neuro.  Also now s/p pentobarb coma (discontinued at 1800 on 8/11). Continue AED's per neuro (Depakote/lacosamide/levetiracetam)  Anoxic encephalopathy- GPEDs with burst suppression pattern is a poor prognosis for meaningful neurological recovery.  We have continued with aggressive measures for seizure suppression  due to minimal changes on the MRI; however, repeat EEG 8/11-8/12 unfortunately showed continuous spikes as sedation was lifted.  Acute respiratory failure-due to above, no weaning.  Given continuous spikes on repeat EEG, likely needs further discussions regarding goals of care in setting very poor prognosis (have informed pt that we will wait for neurology to round today and give clear feedback / opinion after EEG is reviewed by them). Continue Lasix 40 every 12 for negative balance, replace potassium in anticipation  Bibasilar pneumonia-improved, ceftriaxone discontinued, procalcitonin normalized but fevers persist and WBC count rising.  Pending family discussion with neuro, might need to restart abx and reculture (holding off for now incase of transition to comfort care).   Family updated at bedside.   CC time: 30 min.   Rutherford Guysahul Hercules Hasler, [place] - [facility] [place] [facility] Pager: [phone]  or [phone] 01/02/2018, 10:05 AM

## 2018-01-02 NOTE — Progress Notes (Signed)
VASCULAR LAB PRELIMINARY  PRELIMINARY  PRELIMINARY  PRELIMINARY  Bilateral lower extremity venous duplex completed.    Preliminary report:  There is no DVT or SVT noted in the bilateral lower extremities.   Ina Scrivens, RVT 01/02/2018, 5:01 PM

## 2018-01-02 NOTE — Progress Notes (Signed)
Spoke with EEG team, EEG monitor shut off and kept at nurse's station for pickup in am.  Margot AblesWhitney Gillon, RN

## 2018-01-02 NOTE — Progress Notes (Signed)
Patient was alone at the time. Prayed for her and the family during this difficult time.  Prayed for peace of mind and peace of heart. Thanks for all the staff caring for her and for healing processes to work in her. Phebe CollaDonna S Kainat Pizana, Chaplain   01/02/18 1400  Clinical Encounter Type  Visited With Patient  Visit Type Follow-up;Spiritual support  Referral From Chaplain  Consult/Referral To Chaplain  Spiritual Encounters  Spiritual Needs Prayer

## 2018-01-02 NOTE — Progress Notes (Signed)
   Interim update  - family meeting - per APP they are wondring about transer to [facility] - APP will inform primary service of OB to arrange  - will start abx   -  Recent Labs  Lab 12/29/17 0747 12/30/17 0417 12/31/17 0410 01/01/18 0444 01/02/18 0726  PLT 193 218 175 115* 95*   PLAt while on heparin since 12/23/17 and date of admit  12/22/2017   Plan Get duple LE Check HITT panel Dc all heparin products incluydng sq proph heparin   Dr. Kalman ShanMurali Chaundra Abreu, M.D., [facility]C.P Pulmonary and Critical Care Medicine Staff Physician, [facility] Director - Interstitial Lung Disease  Program  Pulmonary Fibrosis [facility] - [facility] [place], [place], [id number]  Pager: [phone], If no answer or between  15:00h - 7:00h: call [phone] Telephone: [phone]

## 2018-01-02 NOTE — Progress Notes (Signed)
Neurology Progress Note   S:// Patient seen and examined this morning.  Long-term video monitoring EEG report from last night reviewed and discussed with Dr. Doree Albee.  O:// Current vital signs: BP (!) 147/75   Pulse (!) 120   Temp (!) 101 F (38.3 C)   Resp (!) 28   Ht _0  (1.6 m)   Wt 121 kg   LMP 03/29/2017   SpO2 98%   Breastfeeding? Unknown   BMI 47.25 kg/m  Vital signs in last 24 hours: Temp:  [99.3 F (37.4 C)-102 F (38.9 C)] 101 F (38.3 C) (08/12 3094) Pulse Rate:  [101-127] 120 (08/12 1000) Resp:  [27-36] 28 (08/12 1000) BP: (112-167)/(62-96) 147/75 (08/12 1000) SpO2:  [96 %-100 %] 98 % (08/12 1000) FiO2 (%):  [40 %] 40 % (08/12 0816) Weight:  [076 kg] 121 kg (08/12 0402) General: Intubated, sedated H ENT: Normocephalic atraumatic with swollen tongue, endotracheal tube in place CVS: S1-S2 heard, regular rhythm Abdomen: Distended, nontender Extremities: Generalized edema all over Neurological exam Sedated intubated No spontaneous movements Exhibits myoclonic movements of the abdomen and whole body Does not respond to voice or commands. Cranial nerves: Pupils are 6 mm, reactive, corneal reflexes are present, no gaze preference, facial symmetry difficult to ascertain. Motor exam: No spontaneous movements noted. Sensory exam: Triple flexion in both lower extremities to noxious stim.  No response to noxious numbness in the upper extremities. Coordination and gait cannot be assessed because of the patient's current mental status.  Medications  Current Facility-Administered Medications:  .  0.9 %  sodium chloride infusion, , Intravenous, Continuous PRN, Bobbye Charleston, MD, Last Rate: 10 mL/hr at 12/30/17 2323 .  albuterol (PROVENTIL) (2.5 MG/3ML) 0.083% nebulizer solution 2.5 mg, 2.5 mg, Nebulization, Q6H, Icard, Bradley L, DO, 2.5 mg at 01/02/18 0814 .  bisacodyl (DULCOLAX) suppository 10 mg, 10 mg, Rectal, Daily PRN, Bobbye Charleston, MD, 10 mg at 12/27/17  0948 .  chlorhexidine gluconate (MEDLINE KIT) (PERIDEX) 0.12 % solution 15 mL, 15 mL, Mouth Rinse, BID, Icard, Bradley L, DO, 15 mL at 01/02/18 0841 .  Chlorhexidine Gluconate Cloth 2 % PADS 6 each, 6 each, Topical, Daily, Juanito Doom, MD, 6 each at 01/01/18 2118 .  coconut oil, 1 application, Topical, PRN, Bobbye Charleston, MD, 1 application at 80/88/11 1930 .  [DISCONTINUED] witch hazel-glycerin (TUCKS) pad 1 application, 1 application, Topical, PRN **AND** dibucaine (NUPERCAINAL) 1 % rectal ointment 1 application, 1 application, Rectal, PRN, Bobbye Charleston, MD .  docusate (COLACE) 50 MG/5ML liquid 100 mg, 100 mg, Per Tube, BID, Jennelle Human B, NP, 100 mg at 01/01/18 0851 .  famotidine (PEPCID) IVPB 20 mg premix, 20 mg, Intravenous, Q12H, Masters, Jake Church, RPH, Last Rate: 100 mL/hr at 01/02/18 1000 .  feeding supplement (VITAL HIGH PROTEIN) liquid 1,000 mL, 1,000 mL, Per Tube, Continuous, Rigoberto Noel, MD, Last Rate: 65 mL/hr at 01/02/18 1000 .  fentaNYL (SUBLIMAZE) 5000 mcg / 100 mL (50 mcg/mL) infusion, 25-400 mcg/hr, Intravenous, Continuous, Bobbye Charleston, MD, Stopped at 12/30/17 0630 .  fentaNYL (SUBLIMAZE) bolus via infusion 50 mcg, 50 mcg, Intravenous, Q1H PRN, Icard, Bradley L, DO, 50 mcg at 12/27/17 1140 .  furosemide (LASIX) injection 40 mg, 40 mg, Intravenous, Q12H, Rigoberto Noel, MD, 40 mg at 01/02/18 0946 .  Gerhardt's butt cream, , Topical, TID, Rigoberto Noel, MD .  heparin injection 5,000 Units, 5,000 Units, Subcutaneous, Q8H, Bobbye Charleston, MD, 5,000 Units at 01/02/18 0542 .  ibuprofen (ADVIL,MOTRIN) 100 MG/5ML suspension  400 mg, 400 mg, Oral, Q6H PRN, Anders Simmonds, MD, 400 mg at 01/02/18 0542 .  labetalol (NORMODYNE,TRANDATE) injection 10-20 mg, 10-20 mg, Intravenous, Q2H PRN, Rigoberto Noel, MD, 10 mg at 12/29/17 1351 .  lacosamide (VIMPAT) 200 mg in sodium chloride 0.9 % 25 mL IVPB, 200 mg, Intravenous, Q12H, Greta Doom, MD, Stopped at  01/01/18 2314 .  levETIRAcetam (KEPPRA) IVPB 1500 mg/ 100 mL premix, 1,500 mg, Intravenous, Q12H, Greta Doom, MD, Last Rate: 400 mL/hr at 01/02/18 0306, 1,500 mg at 01/02/18 0306 .  MEDLINE mouth rinse, 15 mL, Mouth Rinse, 10 times per day, Icard, Bradley L, DO, 15 mL at 01/02/18 0951 .  methylergonovine (METHERGINE) tablet 0.2 mg, 0.2 mg, Oral, Q4H PRN **OR** methylergonovine (METHERGINE) injection 0.2 mg, 0.2 mg, Intramuscular, Q4H PRN, Bobbye Charleston, MD .  midazolam (VERSED) injection 1-2 mg, 1-2 mg, Intravenous, Q1H PRN, Minor, Grace Bushy, NP, 2 mg at 12/26/17 1326 .  norepinephrine (LEVOPHED) 50m in D5W 2562mpremix infusion, 0-40 mcg/min, Intravenous, Titrated, Alva, Rakesh V, MD .  ondansetron (ZOFRAN) injection 4 mg, 4 mg, Intravenous, Q8H PRN, HoBarnet GlasgowMD .  polyethylene glycol (MIRALAX / GLYCOLAX) packet 17 g, 17 g, Per Tube, BID, YaRush FarmerMD, Stopped at 12/29/17 2205 .  polyvinyl alcohol (LIQUIFILM TEARS) 1.4 % ophthalmic solution 1 drop, 1 drop, Both Eyes, PRN, YaRush FarmerMD, 1 drop at 12/29/17 1336 .  potassium chloride 20 MEQ/15ML (10%) solution 40 mEq, 40 mEq, Per Tube, BID, Desai, Rahul P, PA-C .  prenatal multivitamin tablet 1 tablet, 1 tablet, Oral, Q1200, HoBobbye CharlestonMD, 1 tablet at 01/01/18 1226 .  sodium chloride flush (NS) 0.9 % injection 10-40 mL, 10-40 mL, Intracatheter, Q12H, McQuaid, Douglas B, MD, 10 mL at 01/02/18 0949 .  sodium chloride flush (NS) 0.9 % injection 10-40 mL, 10-40 mL, Intracatheter, PRN, McSimonne Maffucci, MD .  valproate (DEPACON) 2,250 mg in dextrose 5 % 50 mL IVPB, 2,250 mg, Intravenous, Q6H, KiGreta DoomMD, Stopped at 01/02/18 [phone] [id number]  white petrolatum (VASELINE) gel, , Topical, PRN, HoBobbye CharlestonMD, 0.2 application at [id number]   Labs Leukocytosis with a white count of 20.8, anemia with hemoglobin of 9.8,  CBC    Component Value Date/Time   WBC 20.8 (H) 01/02/2018 0726   RBC  3.80 (L) 01/02/2018 0726   HGB 9.8 (L) 01/02/2018 0726   HCT 30.8 (L) 01/02/2018 0726   PLT 95 (L) 01/02/2018 0726   MCV 81.1 01/02/2018 0726   MCH 25.8 (L) 01/02/2018 0726   MCHC 31.8 01/02/2018 0726   RDW 17.0 (H) 01/02/2018 0726   LYMPHSABS 1.3 01/01/2018 0444   MONOABS 0.9 01/01/2018 0444   EOSABS 0.2 01/01/2018 0444   BASOSABS 0.0 01/01/2018 0444    CMP     Component Value Date/Time   NA 139 01/02/2018 0328   K 4.9 01/02/2018 0328   CL 101 01/02/2018 0328   CO2 26 01/02/2018 0328   GLUCOSE 115 (H) 01/02/2018 0328   BUN 21 (H) 01/02/2018 0328   CREATININE 0.54 01/02/2018 0328   CALCIUM 7.9 (L) 01/02/2018 0328   PROT 4.2 (L) 12/25/2017 0436   ALBUMIN 1.7 (L) 12/25/2017 0436   AST 42 (H) 12/25/2017 0436   ALT 26 12/25/2017 0436   ALKPHOS 94 12/25/2017 0436   BILITOT 0.5 12/25/2017 0436   GFRNONAA >60 01/02/2018 0328   GFRAA >60 01/02/2018 0328   Imaging I have reviewed images in epic  and the results pertinent to this consultation are: MRI examination of the brain done on 8 2, 8/5 and 12/30/2017.  Initial MRI is not consistent with anoxic brain injury but the MRI from 12/30/2017 shows left parietal and occipital T2 and flair hyperintensities that have developed since prior imaging.  I believe that the anoxic brain injury is more generalized and has been reported on the MRI of 12/30/2017.  Overnight EEG report from Dr. Doree Albee: SUMMARY: This was a markedly abnormal continuous video EEG due toa resolvedburst-suppression pattern and generalized background slowing. Continuous spiking was present in the bilateral central regions as sedation was lifted overnight, similar to the patient's previous EEG recording prior to sedation trials. There was no clear reactivity. There was some emergence of low voltage delta background over the past 24 hours however.    Assessment:  42 year old woman with a past medical history of anoxic brain injury status post cardiac arrest, who was burst  suppressed for the second time using, which was weaned off yesterday. Upon discontinuation of pentobarbital, the EEG pattern showed resolved burst suppression and generalized background slowing with continuous spiking in bilateral central regions that was similar to the prior EEG prior to the sedation trials.  No clear reactivity and some minimal emergence of low voltage delta was seen. All of this points to a poor neurological prognosis going forward. I had a detailed discussion with the patient's husband, mother and other family members at the bedside. At this point, I would not offer any further seizure treatment and can only recommend symptomatic treatment for myoclonus for comfort. The husband has requested transfer to an academic center, which I believe will not be of much use but I am not against the idea of trying to get an opinion from an academic center.  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 The family requested trying to transfer the patient to an academic center.  I have requested the critical care team for assistance in the transfer process. I will be available as needed. I have attempted to answer all the questions that the family had for me.  I have also relayed my plan to the critical care team.  -- Amie Portland, MD [facility] Pager: [phone] If 7pm to 7am, please call on call as listed on AMION.  CRITICAL CARE ATTESTATION This patient is critically ill and at significant risk of neurological worsening, death and care requires constant monitoring of vital signs, hemodynamics,respiratory and cardiac monitoring. I spent 30  minutes of neurocritical care time performing  neurological assessment, discussion with family, other specialists and medical decision making of high complexityin the care of  this patient.

## 2018-01-03 LAB — CULTURE, RESPIRATORY W GRAM STAIN

## 2018-01-03 LAB — CULTURE, BLOOD (ROUTINE X 2)

## 2018-01-03 LAB — CULTURE, RESPIRATORY

## 2018-01-03 LAB — HEPARIN INDUCED PLATELET AB (HIT ANTIBODY): Heparin Induced Plt Ab: 2.204 {OD_unit} — ABNORMAL HIGH (ref 0.000–0.400)

## 2018-01-03 MED ORDER — ACETAMINOPHEN 325 MG PO TABS
650.00 | ORAL_TABLET | ORAL | Status: DC
Start: ? — End: 2018-01-03

## 2018-01-03 MED ORDER — LIDOCAINE HCL 1 % IJ SOLN
3.00 | INTRAMUSCULAR | Status: DC
Start: ? — End: 2018-01-03

## 2018-01-03 MED ORDER — CHLORHEXIDINE GLUCONATE 0.12 % MT SOLN
5.00 | OROMUCOSAL | Status: DC
Start: 2018-01-14 — End: 2018-01-03

## 2018-01-03 MED ORDER — MIDAZOLAM HCL 10 MG/2ML IJ SOLN
0.50 | INTRAMUSCULAR | Status: DC
Start: ? — End: 2018-01-03

## 2018-01-03 MED ORDER — GENERIC EXTERNAL MEDICATION
2.00 | Status: DC
Start: 2018-01-04 — End: 2018-01-03

## 2018-01-03 MED ORDER — GENERIC EXTERNAL MEDICATION
Status: DC
Start: ? — End: 2018-01-03

## 2018-01-03 MED ORDER — SODIUM CHLORIDE 0.9 % IV SOLN
INTRAVENOUS | Status: DC
Start: ? — End: 2018-01-03

## 2018-01-03 MED ORDER — GENERIC EXTERNAL MEDICATION
500.00 | Status: DC
Start: 2018-01-04 — End: 2018-01-03

## 2018-01-03 MED ORDER — GENERIC EXTERNAL MEDICATION
1.00 | Status: DC
Start: ? — End: 2018-01-03

## 2018-01-03 MED ORDER — RANITIDINE HCL 150 MG PO TABS
150.00 | ORAL_TABLET | ORAL | Status: DC
Start: 2018-02-08 — End: 2018-01-03

## 2018-01-03 MED ORDER — LABETALOL HCL 5 MG/ML IV SOLN
10.00 | INTRAVENOUS | Status: DC
Start: ? — End: 2018-01-03

## 2018-01-03 MED ORDER — LIDOCAINE HCL 1 % IJ SOLN
.50 | INTRAMUSCULAR | Status: DC
Start: ? — End: 2018-01-03

## 2018-01-03 MED ORDER — CLOBAZAM 10 MG PO TABS
20.00 | ORAL_TABLET | ORAL | Status: DC
Start: 2018-01-08 — End: 2018-01-03

## 2018-01-03 MED ORDER — RANITIDINE HCL 75 MG/5ML PO SYRP
150.00 | ORAL_SOLUTION | ORAL | Status: DC
Start: 2018-01-03 — End: 2018-01-03

## 2018-01-03 MED ORDER — HYDRALAZINE HCL 20 MG/ML IJ SOLN
10.00 | INTRAMUSCULAR | Status: DC
Start: ? — End: 2018-01-03

## 2018-01-03 MED ORDER — MAGNESIUM SULFATE 2 GM/50ML IV SOLN
2.00 | INTRAVENOUS | Status: DC
Start: ? — End: 2018-01-03

## 2018-01-03 MED ORDER — GENERIC EXTERNAL MEDICATION
200.00 | Status: DC
Start: 2018-01-07 — End: 2018-01-03

## 2018-01-03 MED ORDER — NOREPINEPHRINE-SODIUM CHLORIDE 16-0.9 MG/250ML-% IV SOLN
0.01 | INTRAVENOUS | Status: DC
Start: ? — End: 2018-01-03

## 2018-01-03 MED ORDER — GENERIC EXTERNAL MEDICATION
50.00 | Status: DC
Start: ? — End: 2018-01-03

## 2018-01-03 MED ORDER — ACETAMINOPHEN 325 MG PO TABS
650.00 | ORAL_TABLET | ORAL | Status: DC
Start: 2018-01-03 — End: 2018-01-03

## 2018-01-03 MED ORDER — LORAZEPAM 2 MG/ML IJ SOLN
1.00 | INTRAMUSCULAR | Status: DC
Start: ? — End: 2018-01-03

## 2018-01-03 MED ORDER — GENERIC EXTERNAL MEDICATION
1.25 | Status: DC
Start: 2018-01-03 — End: 2018-01-03

## 2018-01-03 MED ORDER — GENERIC EXTERNAL MEDICATION
1.25 | Status: DC
Start: 2018-01-04 — End: 2018-01-03

## 2018-01-03 NOTE — Progress Notes (Signed)
Rt note-Late entry, patient was removed from from ventilator, [facility] transport [facility]

## 2018-01-06 LAB — CULTURE, BLOOD (ROUTINE X 2)
Culture: NO GROWTH
SPECIAL REQUESTS: ADEQUATE

## 2018-01-06 MED ORDER — ACETAMINOPHEN 325 MG PO TABS
650.00 | ORAL_TABLET | ORAL | Status: DC
Start: 2018-01-11 — End: 2018-01-06

## 2018-01-06 MED ORDER — BRIVARACETAM 50 MG/5ML IV SOLN
100.00 | INTRAVENOUS | Status: DC
Start: 2018-01-14 — End: 2018-01-06

## 2018-01-06 MED ORDER — GENERIC EXTERNAL MEDICATION
500.00 | Status: DC
Start: 2018-01-06 — End: 2018-01-06

## 2018-01-06 MED ORDER — CLOTRIMAZOLE 1 % EX CREA
TOPICAL_CREAM | CUTANEOUS | Status: DC
Start: 2018-01-18 — End: 2018-01-06

## 2018-01-06 MED ORDER — GENERIC EXTERNAL MEDICATION
0.25 | Status: DC
Start: ? — End: 2018-01-06

## 2018-01-06 MED ORDER — GENERIC EXTERNAL MEDICATION
1.00 | Status: DC
Start: 2018-01-06 — End: 2018-01-06

## 2018-01-06 MED ORDER — NOREPINEPHRINE-SODIUM CHLORIDE 16-0.9 MG/250ML-% IV SOLN
0.01 | INTRAVENOUS | Status: DC
Start: ? — End: 2018-01-06

## 2018-01-06 MED ORDER — VANCOMYCIN HCL 50 MG/ML PO SOLR
500.00 | ORAL | Status: DC
Start: 2018-01-14 — End: 2018-01-06

## 2018-01-06 MED ORDER — METRONIDAZOLE IN NACL 5-0.79 MG/ML-% IV SOLN
500.00 | INTRAVENOUS | Status: DC
Start: 2018-01-14 — End: 2018-01-06

## 2018-01-06 MED ORDER — IBUPROFEN 600 MG PO TABS
600.00 | ORAL_TABLET | ORAL | Status: DC
Start: ? — End: 2018-01-06

## 2018-01-06 MED ORDER — GENERIC EXTERNAL MEDICATION
5.00 | Status: DC
Start: ? — End: 2018-01-06

## 2018-01-07 MED ORDER — GENERIC EXTERNAL MEDICATION
1.50 | Status: DC
Start: ? — End: 2018-01-07

## 2018-01-07 MED ORDER — SENNOSIDES-DOCUSATE SODIUM 8.6-50 MG PO TABS
2.00 | ORAL_TABLET | ORAL | Status: DC
Start: 2018-01-11 — End: 2018-01-07

## 2018-01-07 MED ORDER — EPINEPHRINE HCL-DEXTROSE 8-5 MG/250ML-% IV SOLN
.01 | INTRAVENOUS | Status: DC
Start: ? — End: 2018-01-07

## 2018-01-07 MED ORDER — GENERIC EXTERNAL MEDICATION
600.00 | Status: DC
Start: 2018-01-10 — End: 2018-01-07

## 2018-01-07 MED ORDER — LACTULOSE 10 GM/15ML PO SOLN
30.00 | ORAL | Status: DC
Start: ? — End: 2018-01-07

## 2018-01-07 MED ORDER — PHENOBARBITAL SODIUM 130 MG/ML IJ SOLN
90.00 | INTRAMUSCULAR | Status: DC
Start: 2018-01-08 — End: 2018-01-07

## 2018-01-07 MED ORDER — POLYETHYLENE GLYCOL 3350 17 G PO PACK
17.00 | PACK | ORAL | Status: DC
Start: 2018-01-11 — End: 2018-01-07

## 2018-01-07 MED ORDER — GENERIC EXTERNAL MEDICATION
750.00 | Status: DC
Start: 2018-01-08 — End: 2018-01-07

## 2018-01-07 MED ORDER — GENERIC EXTERNAL MEDICATION
0.04 | Status: DC
Start: ? — End: 2018-01-07

## 2018-01-09 MED ORDER — PHENOBARBITAL SODIUM 130 MG/ML IJ SOLN
130.00 | INTRAMUSCULAR | Status: DC
Start: 2018-01-18 — End: 2018-01-09

## 2018-01-09 MED ORDER — GENERIC EXTERNAL MEDICATION
300.00 | Status: DC
Start: 2018-01-14 — End: 2018-01-09

## 2018-01-09 MED ORDER — MIDAZOLAM HCL 10 MG/2ML IJ SOLN
.70 | INTRAMUSCULAR | Status: DC
Start: ? — End: 2018-01-09

## 2018-01-09 MED ORDER — GENERIC EXTERNAL MEDICATION
1000.00 | Status: DC
Start: 2018-01-14 — End: 2018-01-09

## 2018-01-09 MED ORDER — FENTANYL CITRATE (PF) 50 MCG/ML IJ SOLN
25.00 | INTRAMUSCULAR | Status: DC
Start: ? — End: 2018-01-09

## 2018-01-09 MED ORDER — CLOBAZAM 10 MG PO TABS
30.00 | ORAL_TABLET | ORAL | Status: DC
Start: 2018-01-17 — End: 2018-01-09

## 2018-01-14 MED ORDER — GENERIC EXTERNAL MEDICATION
25.00 | Status: DC
Start: ? — End: 2018-01-14

## 2018-01-14 MED ORDER — SENNOSIDES-DOCUSATE SODIUM 8.6-50 MG PO TABS
2.00 | ORAL_TABLET | ORAL | Status: DC
Start: ? — End: 2018-01-14

## 2018-01-14 MED ORDER — POLYETHYLENE GLYCOL 3350 17 G PO PACK
17.00 | PACK | ORAL | Status: DC
Start: ? — End: 2018-01-14

## 2018-01-14 MED ORDER — ACETAMINOPHEN 325 MG PO TABS
650.00 | ORAL_TABLET | ORAL | Status: DC
Start: ? — End: 2018-01-14

## 2018-01-14 MED ORDER — GENERIC EXTERNAL MEDICATION
0.25 | Status: DC
Start: 2018-01-14 — End: 2018-01-14

## 2018-01-14 MED ORDER — MAGNESIUM SULFATE 2 GM/50ML IV SOLN
2.00 | INTRAVENOUS | Status: DC
Start: 2018-02-08 — End: 2018-01-14

## 2018-01-17 MED ORDER — OXYCODONE HCL 5 MG PO TABS
5.00 | ORAL_TABLET | ORAL | Status: DC
Start: ? — End: 2018-01-17

## 2018-01-17 MED ORDER — GENERIC EXTERNAL MEDICATION
1000.00 | Status: DC
Start: ? — End: 2018-01-17

## 2018-01-17 MED ORDER — GENERIC EXTERNAL MEDICATION
0.27 | Status: DC
Start: ? — End: 2018-01-17

## 2018-01-17 MED ORDER — LORAZEPAM 2 MG/ML IJ SOLN
1.00 | INTRAMUSCULAR | Status: DC
Start: ? — End: 2018-01-17

## 2018-01-17 MED ORDER — GENERIC EXTERNAL MEDICATION
300.00 | Status: DC
Start: 2018-01-17 — End: 2018-01-17

## 2018-02-07 MED ORDER — GENERIC EXTERNAL MEDICATION
125.00 | Status: DC
Start: ? — End: 2018-02-07

## 2018-02-07 MED ORDER — MELATONIN 3 MG PO TABS
3.00 | ORAL_TABLET | ORAL | Status: DC
Start: 2018-02-21 — End: 2018-02-07

## 2018-02-07 MED ORDER — HYDRALAZINE HCL 20 MG/ML IJ SOLN
10.00 | INTRAMUSCULAR | Status: DC
Start: ? — End: 2018-02-07

## 2018-02-07 MED ORDER — CLOBAZAM 10 MG PO TABS
10.00 | ORAL_TABLET | ORAL | Status: DC
Start: 2018-02-08 — End: 2018-02-07

## 2018-02-07 MED ORDER — LIDOCAINE HCL 1 % IJ SOLN
3.00 | INTRAMUSCULAR | Status: DC
Start: ? — End: 2018-02-07

## 2018-02-07 MED ORDER — CLONAZEPAM 0.5 MG PO TABS
.50 | ORAL_TABLET | ORAL | Status: DC
Start: 2018-02-08 — End: 2018-02-07

## 2018-02-07 MED ORDER — GENERIC EXTERNAL MEDICATION
8.00 | Status: DC
Start: 2018-02-21 — End: 2018-02-07

## 2018-02-07 MED ORDER — AMANTADINE HCL 100 MG PO CAPS
100.00 | ORAL_CAPSULE | ORAL | Status: DC
Start: 2018-02-08 — End: 2018-02-07

## 2018-02-07 MED ORDER — GENERIC EXTERNAL MEDICATION
130.00 | Status: DC
Start: 2018-02-08 — End: 2018-02-07

## 2018-02-07 MED ORDER — WARFARIN SODIUM 5 MG PO TABS
10.00 | ORAL_TABLET | ORAL | Status: DC
Start: 2018-02-08 — End: 2018-02-07

## 2018-02-07 MED ORDER — ACETAMINOPHEN 325 MG PO TABS
650.00 | ORAL_TABLET | ORAL | Status: DC
Start: 2018-02-08 — End: 2018-02-07

## 2018-02-07 MED ORDER — BACITRACIN-NEOMYCIN-POLYMYXIN 400-5-5000 EX OINT
TOPICAL_OINTMENT | CUTANEOUS | Status: DC
Start: ? — End: 2018-02-07

## 2018-02-21 MED ORDER — DIVALPROEX SODIUM 125 MG PO CSDR
1000.00 | DELAYED_RELEASE_CAPSULE | ORAL | Status: DC
Start: 2018-02-21 — End: 2018-02-21

## 2018-02-21 MED ORDER — CLONAZEPAM 0.5 MG PO TABS
.50 | ORAL_TABLET | ORAL | Status: DC
Start: 2018-02-21 — End: 2018-02-21

## 2018-02-21 MED ORDER — RANITIDINE HCL 150 MG PO TABS
150.00 | ORAL_TABLET | ORAL | Status: DC
Start: 2018-02-21 — End: 2018-02-21

## 2018-02-21 MED ORDER — ONDANSETRON HCL 4 MG/2ML IJ SOLN
4.00 | INTRAMUSCULAR | Status: DC
Start: ? — End: 2018-02-21

## 2018-02-21 MED ORDER — FONDAPARINUX SODIUM 7.5 MG/0.6ML ~~LOC~~ SOLN
7.50 | SUBCUTANEOUS | Status: DC
Start: 2018-02-21 — End: 2018-02-21

## 2018-02-21 MED ORDER — CLOBAZAM 10 MG PO TABS
5.00 | ORAL_TABLET | ORAL | Status: DC
Start: 2018-02-21 — End: 2018-02-21

## 2018-02-21 MED ORDER — CALCIUM CARBONATE ANTACID 750 MG PO CHEW
CHEWABLE_TABLET | ORAL | Status: DC
Start: ? — End: 2018-02-21

## 2018-02-21 MED ORDER — ACETAMINOPHEN 325 MG PO TABS
650.00 | ORAL_TABLET | ORAL | Status: DC
Start: ? — End: 2018-02-21

## 2018-02-21 MED ORDER — MAGNESIUM SULFATE 2 GM/50ML IV SOLN
2.00 | INTRAVENOUS | Status: DC
Start: 2018-02-21 — End: 2018-02-21

## 2018-02-21 MED ORDER — GENERIC EXTERNAL MEDICATION
125.00 | Status: DC
Start: ? — End: 2018-02-21

## 2018-02-21 MED ORDER — OXYBUTYNIN CHLORIDE 5 MG PO TABS
5.00 | ORAL_TABLET | ORAL | Status: DC
Start: 2018-02-21 — End: 2018-02-21

## 2018-03-06 MED ORDER — HYDROCORTISONE 1 % EX CREA
1.00 | TOPICAL_CREAM | CUTANEOUS | Status: DC
Start: 2018-03-07 — End: 2018-03-06

## 2018-03-06 MED ORDER — DIVALPROEX SODIUM 250 MG PO DR TAB
1000.00 | DELAYED_RELEASE_TABLET | ORAL | Status: DC
Start: 2018-03-07 — End: 2018-03-06

## 2018-03-06 MED ORDER — PERAMPANEL 8 MG PO TABS
8.00 | ORAL_TABLET | ORAL | Status: DC
Start: 2018-03-07 — End: 2018-03-06

## 2018-03-06 MED ORDER — MELATONIN 3 MG PO TABS
3.00 | ORAL_TABLET | ORAL | Status: DC
Start: 2018-03-06 — End: 2018-03-06

## 2018-03-06 MED ORDER — FAMOTIDINE 20 MG PO TABS
10.00 | ORAL_TABLET | ORAL | Status: DC
Start: 2018-03-07 — End: 2018-03-06

## 2018-03-06 MED ORDER — OXYBUTYNIN CHLORIDE 5 MG PO TABS
5.00 | ORAL_TABLET | ORAL | Status: DC
Start: ? — End: 2018-03-06

## 2018-03-06 MED ORDER — GENERIC EXTERNAL MEDICATION
125.00 | Status: DC
Start: 2018-03-07 — End: 2018-03-06

## 2018-03-06 MED ORDER — GENERIC EXTERNAL MEDICATION
650.00 | Status: DC
Start: ? — End: 2018-03-06

## 2018-03-06 MED ORDER — GENERIC EXTERNAL MEDICATION
25.00 | Status: DC
Start: ? — End: 2018-03-06

## 2018-03-06 MED ORDER — CLOBAZAM 10 MG PO TABS
5.00 | ORAL_TABLET | ORAL | Status: DC
Start: 2018-03-07 — End: 2018-03-06

## 2018-03-06 MED ORDER — GENERIC EXTERNAL MEDICATION
750.00 | Status: DC
Start: ? — End: 2018-03-06

## 2018-03-06 MED ORDER — FONDAPARINUX SODIUM 7.5 MG/0.6ML ~~LOC~~ SOLN
7.50 | SUBCUTANEOUS | Status: DC
Start: 2018-03-07 — End: 2018-03-06

## 2018-03-06 MED ORDER — GENERIC EXTERNAL MEDICATION
4.00 | Status: DC
Start: ? — End: 2018-03-06

## 2018-03-06 MED ORDER — CLONAZEPAM 0.5 MG PO TABS
0.50 | ORAL_TABLET | ORAL | Status: DC
Start: 2018-03-07 — End: 2018-03-06

## 2018-03-06 MED ORDER — GENERIC EXTERNAL MEDICATION
10.00 | Status: DC
Start: ? — End: 2018-03-06

## 2018-03-06 MED ORDER — LIDOCAINE 4 % EX PTCH
2.00 | MEDICATED_PATCH | CUTANEOUS | Status: DC
Start: 2018-03-07 — End: 2018-03-06

## 2018-03-06 MED ORDER — LACTULOSE 10 GM/15ML PO SOLN
20.00 | ORAL | Status: DC
Start: ? — End: 2018-03-06

## 2018-03-06 MED ORDER — CLONAZEPAM 0.5 MG PO TABS
0.75 | ORAL_TABLET | ORAL | Status: DC
Start: 2018-03-06 — End: 2018-03-06

## 2018-03-06 MED ORDER — LORAZEPAM 2 MG/ML IJ SOLN
1.00 | INTRAMUSCULAR | Status: DC
Start: ? — End: 2018-03-06

## 2018-08-30 IMAGING — DX DG CHEST 1V PORT
1 series · 1 of 1 positions shown · non-contrast
Comparison: 12/31/2017

CLINICAL DATA: Respiratory failure

EXAM:
PORTABLE CHEST 1 VIEW

[chest ap]
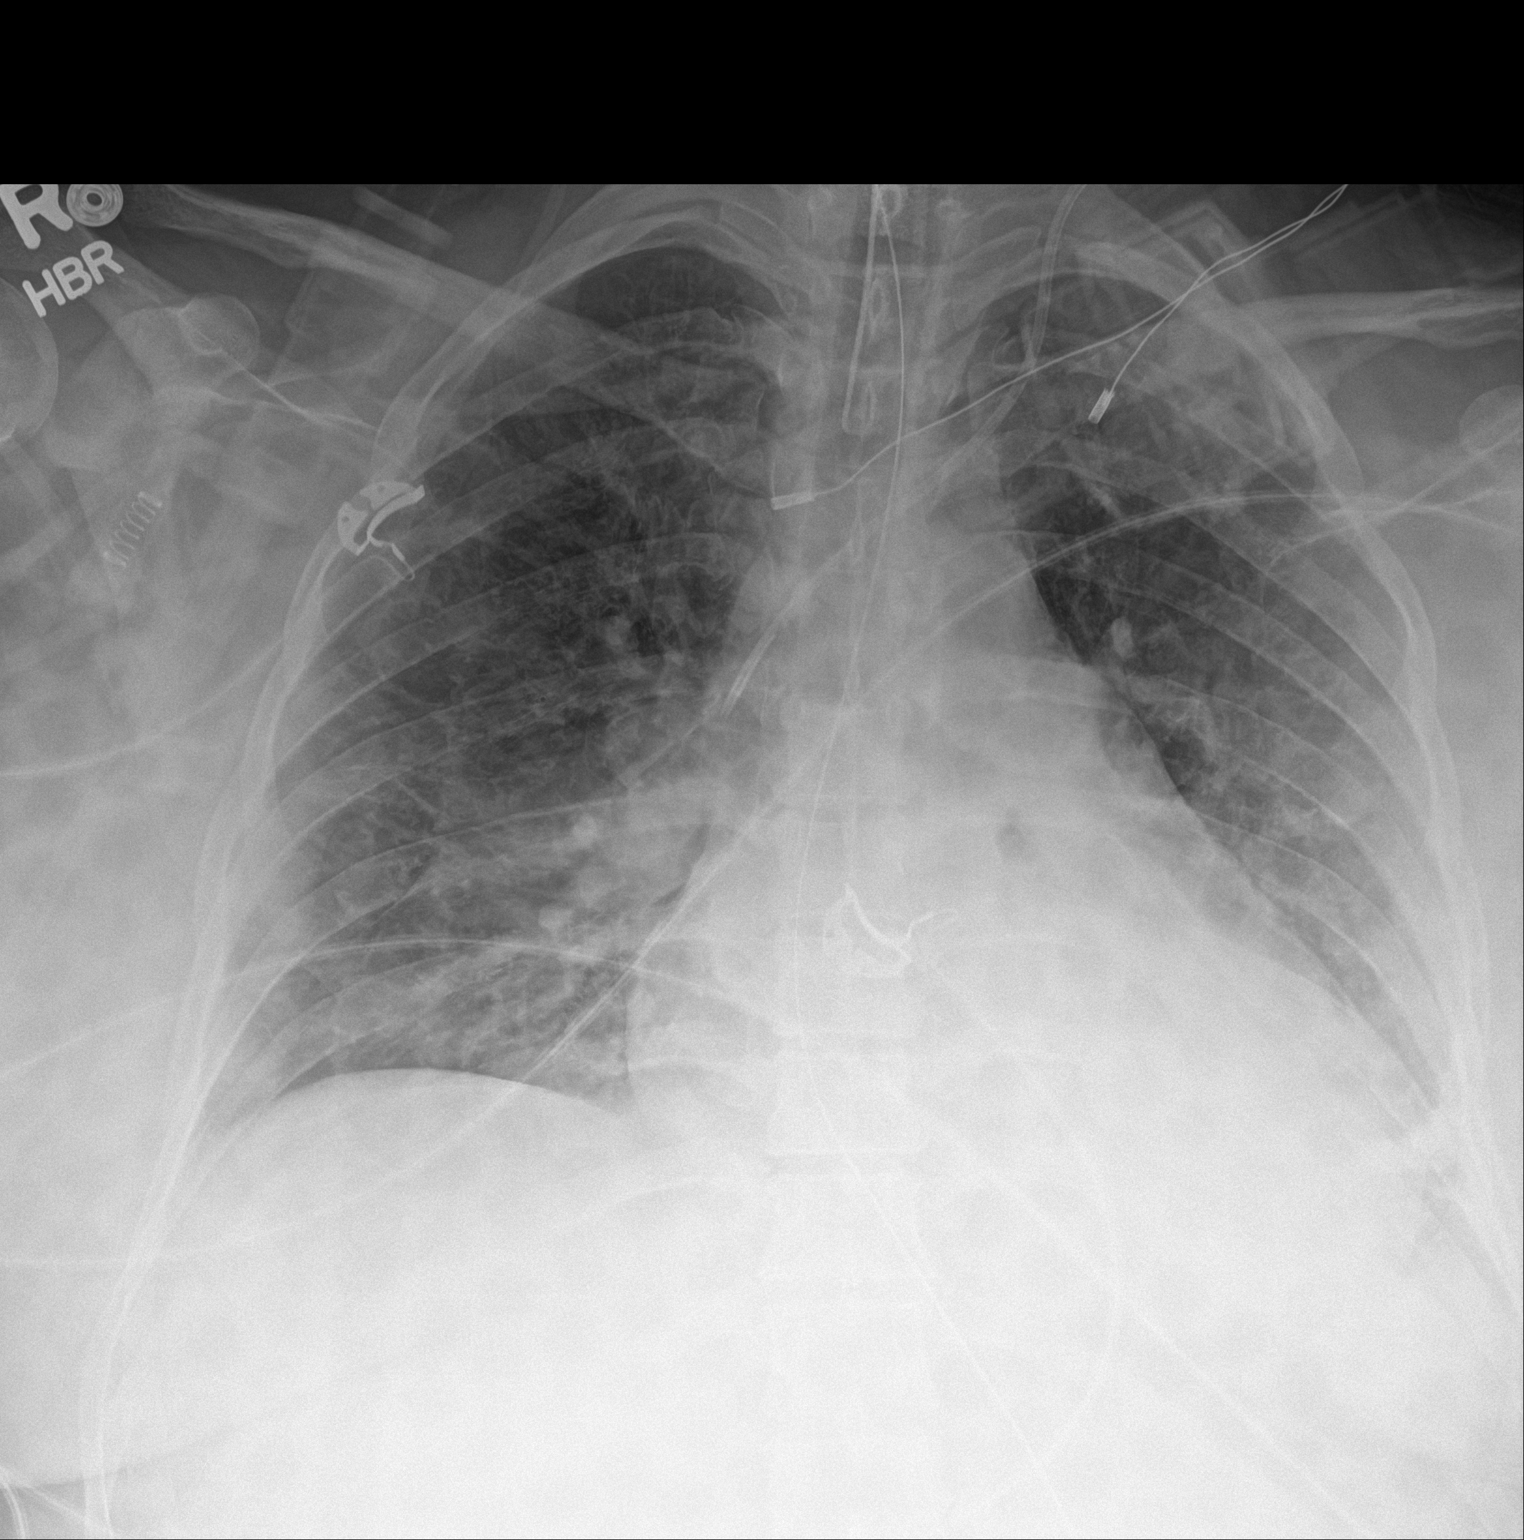

[1 of 1 positions shown; findings below may reference images not displayed]

FINDINGS: Mild left basilar opacity, likely atelectasis. Right lung is clear.
No pleural effusion or pneumothorax.

The heart is normal in size.

Endotracheal tube terminates 6 cm above the carina.

Left IJ venous catheter terminates in the lower SVC.
IMPRESSION: Endotracheal tube terminates 6 cm above the carina.

Mild left basilar opacity, likely atelectasis.

## 2019-04-23 ENCOUNTER — Ambulatory Visit (INDEPENDENT_AMBULATORY_CARE_PROVIDER_SITE_OTHER): Payer: No Typology Code available for payment source | Admitting: Otolaryngology

## 2019-04-23 ENCOUNTER — Other Ambulatory Visit: Payer: Self-pay

## 2019-04-23 DIAGNOSIS — K219 Gastro-esophageal reflux disease without esophagitis: Secondary | ICD-10-CM | POA: Diagnosis not present

## 2019-04-23 DIAGNOSIS — R490 Dysphonia: Secondary | ICD-10-CM

## 2023-09-26 NOTE — Progress Notes (Signed)
 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  We had last seen her in January of 2025.  Interval history (09/26/2023): She did relatively well after her last visit on the higher dose of zonisamide. She was happy about her progress with PT and about her ability to walk using a rollater walker. Last week however, she had worsening of her myoclonic jerks resulting in a fall with forehead laceration that required 4 stitches. Yesterday, she had continuous clonic jerks for approximately 10 minutes and was unresponsive during that period of time. Afterwards, she was very slow to respond and slow to return to baseline (approximately 2 hours). This was an unusually long seizure per mom who had not witnessed any seizures many years. Her prior seizures were brief, on the order of minutes, and typically consisted of staring off and unresponsiveness. She wonders if the worsening of her myoclonic jerks and prolonged seizures might be exacerbated by emotional stressors seeing as her divorce papers were finalized last week. She had a cluster of myoclonic jerks in clinic while emotionally distraught discussing her divorce and relationship with ex-husband. She currently takes lexapro 10mg  daily for mood and is not interested in making medication changes or psychotherapy.   She overall feels like the zonisamide has been effective in controlling the myoclonus and is willing to increase the dose. Last level was 3.1 on 05/26/2023 on a dose of 200mg  nightly. Prior attempts to completely discontinue the keppra  have resulted in worsening of the myoclonus.  Prior history: The precipitating event for her neurologic injury was a cardiac arrest at the time of her C-section in August 2019.  She subsequently had status epilepticus and significantly recovered but has been left with debilitating post anoxic myoclonus.  Brain MRI demonstrates advanced atrophy but no focal abnormalities.  EEG has consistently demonstrated frequent  bifrontal and central spike-wave discharges.  She is divorced from her husband and is disappointed in his support for her and their child.  She is on citalopram.  The mother relates that her emotional health seems to be better of late.  At her visit in September 2024, Laura Valentine was not doing well.  She was having increasing problems with myoclonus and really was unable to walk and at times unable to feed herself.  Her maintenance medications were primidone 250 mg 3 times daily (level of 7.3/19), levetiracetam  500 mg twice daily (11 in August 2023), generic Depakote  750 mg twice daily (level 75) and clonazepam  3.5 mg in divided doses.  At that visit we decided to initiate zonisamide.  She has a distant history of a oral ulcers due to a sulfa antibiotic.  Caregivers had been hesitant to use zonisamide due to possible cross-reactivity.  In any case, zonisamide was initiated and titrated to her present dose of 200 mg nightly.    At her visit in January of 2025, she reported marked improvement.  She was able to walk on a daily basis using her walker and has no difficulty feeding herself.  She did have some worsening myoclonus surrounding her menses.  Her menstrual cycle is regular every 5 to 6 weeks and she wonders whether that could be suppressed or eliminated to help her neurologic status.  Physical exam: BP 139/77 (BP Location: Left upper arm, Patient Position: Sitting, BP Cuff Size: Adult)   Pulse 86   Resp 16   Ht 160 cm (5' 3)   Wt (!) 117.9 kg (260 lb)   BMI 46.06 kg/m  The patient had occasional myoclonus  of upper extremities at rest, which increased in frequency in the setting of emotional upset.  Impression: Post anoxic myoclonus, intractable Intractable epilepsy Status postcardiac arrest with hypoxic injury History of status epilepticus at time of arrest  In the week leading up to this visit, she had a cluster of myoclonic jerks resulting in a fall with forehead laceration and an  unsuual prolonged seizure lasting 10 minutes with loss of consciousness (had not had seizures with loss of consciousness in many yeas prior to this). For this reason, will increase zonisamide dose to 400mg  nightly, which seems to have been effective in reducing the frequency of myoclonic jerks. Will check level today. Will also provide patient with Nayzilam  rescue nasal spray for seizures lasting mor than 3 minutes.   Plan: Zonisamide level today  Increase zonisamide dose to 400mg  nightly Continue primidone 250 mg 3 times daily Continue levetiracetam  500 mg twice daily Continue generic Depakote  750 mg twice daily Continue clonazepam  1 - 1 - 0.5 - 1 mg Nayzilam  rescue Return to clinic 3 months  Patient seen with Dr. Jerene.   Karilyn Nevins, MD Epilepsy Fellow, PGY-6  Attestation Statement:   I personally saw and evaluated the patient, and participated in the management and treatment plan as documented in the resident/fellow note.  RODNEY A RADTKE, MD

## 2024-01-17 ENCOUNTER — Other Ambulatory Visit: Payer: Self-pay

## 2024-01-17 ENCOUNTER — Encounter (HOSPITAL_COMMUNITY): Payer: Self-pay | Admitting: *Deleted

## 2024-01-17 ENCOUNTER — Emergency Department (HOSPITAL_COMMUNITY)
Admission: EM | Admit: 2024-01-17 | Discharge: 2024-01-17 | Discharge status: Against Medical Advice | Source: Ambulatory Visit | Attending: Emergency Medicine | Admitting: Emergency Medicine

## 2024-01-17 ENCOUNTER — Emergency Department (HOSPITAL_COMMUNITY)

## 2024-01-17 DIAGNOSIS — Z5321 Procedure and treatment not carried out due to patient leaving prior to being seen by health care provider: Secondary | ICD-10-CM | POA: Diagnosis not present

## 2024-01-17 DIAGNOSIS — R109 Unspecified abdominal pain: Secondary | ICD-10-CM | POA: Insufficient documentation

## 2024-01-17 DIAGNOSIS — M546 Pain in thoracic spine: Secondary | ICD-10-CM | POA: Insufficient documentation

## 2024-01-17 DIAGNOSIS — R112 Nausea with vomiting, unspecified: Secondary | ICD-10-CM | POA: Insufficient documentation

## 2024-01-17 HISTORY — DX: Unspecified convulsions: R56.9

## 2024-01-17 HISTORY — DX: Myoclonus: G25.3

## 2024-01-17 LAB — URINALYSIS, ROUTINE W REFLEX MICROSCOPIC
Bilirubin Urine: NEGATIVE
Glucose, UA: NEGATIVE mg/dL
Ketones, ur: 20 mg/dL — AB
Leukocytes,Ua: NEGATIVE
Nitrite: NEGATIVE
Protein, ur: 100 mg/dL — AB
Specific Gravity, Urine: 1.014 (ref 1.005–1.030)
pH: 6 (ref 5.0–8.0)

## 2024-01-17 LAB — COMPREHENSIVE METABOLIC PANEL WITH GFR
ALT: 37 U/L (ref 0–44)
AST: 43 U/L — ABNORMAL HIGH (ref 15–41)
Albumin: 3.6 g/dL (ref 3.5–5.0)
Alkaline Phosphatase: 63 U/L (ref 38–126)
Anion gap: 12 (ref 5–15)
BUN: <5 mg/dL — ABNORMAL LOW (ref 6–20)
CO2: 21 mmol/L — ABNORMAL LOW (ref 22–32)
Calcium: 9.2 mg/dL (ref 8.9–10.3)
Chloride: 104 mmol/L (ref 98–111)
Creatinine, Ser: 0.51 mg/dL (ref 0.44–1.00)
GFR, Estimated: >60 mL/min (ref >60)
Glucose, Bld: 132 mg/dL — ABNORMAL HIGH (ref 70–99)
Potassium: 3.3 mmol/L — ABNORMAL LOW (ref 3.5–5.1)
Sodium: 137 mmol/L (ref 135–145)
Total Bilirubin: 0.7 mg/dL (ref 0.0–1.2)
Total Protein: 7.8 g/dL (ref 6.5–8.1)

## 2024-01-17 LAB — CBC
HCT: 42.9 % (ref 36.0–46.0)
Hemoglobin: 14.4 g/dL (ref 12.0–15.0)
MCH: 29.3 pg (ref 26.0–34.0)
MCHC: 33.6 g/dL (ref 30.0–36.0)
MCV: 87.4 fL (ref 80.0–100.0)
Platelets: 222 K/uL (ref 150–400)
RBC: 4.91 MIL/uL (ref 3.87–5.11)
RDW: 13.4 % (ref 11.5–15.5)
WBC: 12.4 K/uL — ABNORMAL HIGH (ref 4.0–10.5)
nRBC: 0 % (ref 0.0–0.2)

## 2024-01-17 LAB — LIPASE, BLOOD: Lipase: 23 U/L (ref 11–51)

## 2024-01-17 LAB — TROPONIN I (HIGH SENSITIVITY): Troponin I (High Sensitivity): 3 ng/L (ref <18)

## 2024-01-17 NOTE — ED Triage Notes (Signed)
 Pt with abd pain since Saturday evening with N/V. LBM Saturday. Also c/o CP and upper back pain.
